# Patient Record
Sex: Female | Born: 1965 | Race: White | Hispanic: No | Smoking: Never smoker
Health system: Southern US, Community
[De-identification: ages and names within clinical notes are randomized; demographics above are authoritative.]

## PROBLEM LIST (undated history)

## (undated) DIAGNOSIS — Z800 Family history of malignant neoplasm of digestive organs: Secondary | ICD-10-CM

## (undated) DIAGNOSIS — N809 Endometriosis, unspecified: Secondary | ICD-10-CM

## (undated) DIAGNOSIS — E66811 Obesity, class 1: Secondary | ICD-10-CM

## (undated) DIAGNOSIS — N920 Excessive and frequent menstruation with regular cycle: Secondary | ICD-10-CM

## (undated) DIAGNOSIS — R112 Nausea with vomiting, unspecified: Secondary | ICD-10-CM

## (undated) DIAGNOSIS — E669 Obesity, unspecified: Secondary | ICD-10-CM

## (undated) DIAGNOSIS — N800 Endometriosis of uterus: Secondary | ICD-10-CM

## (undated) DIAGNOSIS — Z9889 Other specified postprocedural states: Secondary | ICD-10-CM

## (undated) DIAGNOSIS — N63 Unspecified lump in unspecified breast: Secondary | ICD-10-CM

## (undated) DIAGNOSIS — N8003 Adenomyosis of the uterus: Secondary | ICD-10-CM

## (undated) DIAGNOSIS — C4491 Basal cell carcinoma of skin, unspecified: Secondary | ICD-10-CM

## (undated) HISTORY — DX: Obesity, unspecified: E66.9

## (undated) HISTORY — PX: ANTERIOR CRUCIATE LIGAMENT REPAIR: SHX115

## (undated) HISTORY — DX: Unspecified lump in unspecified breast: N63.0

## (undated) HISTORY — DX: Basal cell carcinoma of skin, unspecified: C44.91

## (undated) HISTORY — DX: Excessive and frequent menstruation with regular cycle: N92.0

## (undated) HISTORY — DX: Endometriosis of uterus: N80.0

## (undated) HISTORY — PX: ABDOMINAL HYSTERECTOMY: SHX81

## (undated) HISTORY — DX: Family history of malignant neoplasm of digestive organs: Z80.0

## (undated) HISTORY — DX: Endometriosis, unspecified: N80.9

## (undated) HISTORY — DX: Obesity, class 1: E66.811

## (undated) HISTORY — DX: Adenomyosis of the uterus: N80.03

## (undated) HISTORY — PX: BREAST CYST EXCISION: SHX579

---

## 1985-01-25 HISTORY — PX: CRYOTHERAPY: SHX1416

## 1986-01-25 HISTORY — PX: CERVICAL CONE BIOPSY: SUR198

## 2004-09-15 ENCOUNTER — Ambulatory Visit: Payer: Self-pay

## 2006-01-06 ENCOUNTER — Ambulatory Visit: Payer: Self-pay

## 2007-01-10 ENCOUNTER — Ambulatory Visit: Payer: Self-pay

## 2009-01-25 HISTORY — PX: LAPAROSCOPIC SUPRACERVICAL HYSTERECTOMY: SUR797

## 2009-01-25 HISTORY — PX: DILATION AND CURETTAGE, DIAGNOSTIC / THERAPEUTIC: SUR384

## 2009-01-25 HISTORY — PX: CARPAL TUNNEL RELEASE: SHX101

## 2009-09-26 ENCOUNTER — Ambulatory Visit: Payer: Self-pay

## 2009-10-02 ENCOUNTER — Ambulatory Visit: Payer: Self-pay

## 2009-10-06 LAB — PATHOLOGY REPORT

## 2009-10-24 ENCOUNTER — Ambulatory Visit: Payer: Self-pay

## 2009-10-25 HISTORY — PX: LAPAROSCOPIC SUPRACERVICAL HYSTERECTOMY: SUR797

## 2009-10-28 ENCOUNTER — Ambulatory Visit: Payer: Self-pay

## 2010-01-02 ENCOUNTER — Ambulatory Visit: Payer: Self-pay | Admitting: Unknown Physician Specialty

## 2010-01-13 ENCOUNTER — Ambulatory Visit: Payer: Self-pay | Admitting: Unknown Physician Specialty

## 2015-04-01 ENCOUNTER — Encounter: Payer: Self-pay | Admitting: General Surgery

## 2015-04-07 ENCOUNTER — Ambulatory Visit (INDEPENDENT_AMBULATORY_CARE_PROVIDER_SITE_OTHER): Payer: BLUE CROSS/BLUE SHIELD | Admitting: General Surgery

## 2015-04-07 ENCOUNTER — Encounter: Payer: Self-pay | Admitting: General Surgery

## 2015-04-07 VITALS — BP 138/86 | HR 76 | Resp 12 | Ht 65.0 in | Wt 199.0 lb

## 2015-04-07 DIAGNOSIS — Z1211 Encounter for screening for malignant neoplasm of colon: Secondary | ICD-10-CM

## 2015-04-07 MED ORDER — POLYETHYLENE GLYCOL 3350 17 GM/SCOOP PO POWD: ORAL | Status: DC

## 2015-04-07 NOTE — Patient Instructions (Addendum)
Colonoscopy A colonoscopy is an exam to look at the entire large intestine (colon). This exam can help find problems such as tumors, polyps, inflammation, and areas of bleeding. The exam takes about 1 hour.  LET [facility] CARE PROVIDER KNOW ABOUT:   Any allergies you have.  All medicines you are taking, including vitamins, herbs, eye drops, creams, and over-the-counter medicines.  Previous problems you or members of your family have had with the use of anesthetics.  Any blood disorders you have.  Previous surgeries you have had.  Medical conditions you have. RISKS AND COMPLICATIONS  Generally, this is a safe procedure. However, as with any procedure, complications can occur. Possible complications include:  Bleeding.  Tearing or rupture of the colon wall.  Reaction to medicines given during the exam.  Infection (rare). BEFORE THE PROCEDURE   Ask your health care provider about changing or stopping your regular medicines.  You may be prescribed an oral bowel prep. This involves drinking a large amount of medicated liquid, starting the day before your procedure. The liquid will cause you to have multiple loose stools until your stool is almost clear or light green. This cleans out your colon in preparation for the procedure.  Do not eat or drink anything else once you have started the bowel prep, unless your health care provider tells you it is safe to do so.  Arrange for someone to drive you home after the procedure. PROCEDURE   You will be given medicine to help you relax (sedative).  You will lie on your side with your knees bent.  A long, flexible tube with a light and camera on the end (colonoscope) will be inserted through the rectum and into the colon. The camera sends video back to a computer screen as it moves through the colon. The colonoscope also releases carbon dioxide gas to inflate the colon. This helps your health care provider see the area better.  During  the exam, your health care provider may take a small tissue sample (biopsy) to be examined under a microscope if any abnormalities are found.  The exam is finished when the entire colon has been viewed. AFTER THE PROCEDURE   Do not drive for 24 hours after the exam.  You may have a small amount of blood in your stool.  You may pass moderate amounts of gas and have mild abdominal cramping or bloating. This is caused by the gas used to inflate your colon during the exam.  Ask when your test results will be ready and how you will get your results. Make sure you get your test results.   This information is not intended to replace advice given to you by your health care provider. Make sure you discuss any questions you have with your health care provider.   Document Released: 01/09/2000 Document Revised: 11/01/2012 Document Reviewed: 09/18/2012 Elsevier Interactive Patient Education [employer].  Patient has been scheduled for a colonoscopy on 06-04-15 at [facility].

## 2015-04-07 NOTE — Progress Notes (Signed)
Patient ID: Stephanie Wade, female   DOB: [birth year], 50 y.o.   MRN: [id number]  Chief Complaint  Patient presents with  . Colonoscopy    HPI Stephanie Wade is a 50 y.o. female here today for a evaluation of a screening colonoscopy. No GI problems at this time. Occasional hemorrhoids. Maternal grandmother has passed from colon cancer. No current health issues. Hysterectomy performed for uterine fibroids.  I have reviewed the history of present illness with the patient.  HPI  History reviewed. No pertinent past medical history.  Past Surgical History  Procedure Laterality Date  . Abdominal hysterectomy    . Hand surgery  2011    History reviewed. No pertinent family history.  Social History Social History  Substance Use Topics  . Smoking status: Never Smoker   . Smokeless tobacco: None  . Alcohol Use: 0.0 oz/week    0 Standard drinks or equivalent per week    No Known Allergies  Current Outpatient Prescriptions  Medication Sig Dispense Refill  . polyethylene glycol powder (GLYCOLAX/MIRALAX) powder 255 grams one bottle for colonoscopy prep 255 g 0   No current facility-administered medications for this visit.    Review of Systems Review of Systems  Constitutional: Negative.   Respiratory: Negative.   Cardiovascular: Negative.   Gastrointestinal: Negative.     Blood pressure 138/86, pulse 76, resp. rate 12, height 5\' 5"  (1.651 m), weight 199 lb (90.266 kg).  Physical Exam Physical Exam  Constitutional: She is oriented to person, place, and time. She appears well-developed and well-nourished.  Eyes: Conjunctivae are normal. No scleral icterus.  Neck: Neck supple.  Cardiovascular: Normal rate, regular rhythm and normal heart sounds.   Pulmonary/Chest: Effort normal and breath sounds normal.  Abdominal: Soft. Normal appearance and bowel sounds are normal. There is no hepatomegaly. There is no tenderness. No hernia.  Lymphadenopathy:    She has no cervical  adenopathy.  Neurological: She is alert and oriented to person, place, and time.  Skin: Skin is warm and dry.  Psychiatric: Her behavior is normal.    Data Reviewed Notes reviewed  Assessment    Stable exam, no significant findings noted. Family history remote -in grandmother only. Discussed role of colonoscopy as a screening method. Risks and benefits explained. Pt is agreeable.     Plan    Colonoscopy with possible biopsy/polypectomy prn: Information regarding the procedure, including its potential risks and complications (including but not limited to perforation of the bowel, which may require emergency surgery to repair, and bleeding) was verbally given to the patient. Educational information regarding lower intestinal endoscopy was given to the patient. Written instructions for how to complete the bowel prep using Miralax were provided. The importance of drinking ample fluids to avoid dehydration as a result of the prep emphasized.    Patient has been scheduled for a colonoscopy on 06-04-15 at [facility].   This information has been scribed by Gaspar Cola CMA.   Chasitty Hehl G 04/07/2015, 4:21 PM

## 2015-06-04 ENCOUNTER — Encounter
Admission: RE | Disposition: A | Discharge status: Home / Self Care | Payer: Self-pay | Source: Ambulatory Visit | Attending: General Surgery

## 2015-06-04 ENCOUNTER — Ambulatory Visit
Admission: RE | Admit: 2015-06-04 | Discharge: 2015-06-04 | Disposition: A | Discharge status: Home / Self Care | Payer: BLUE CROSS/BLUE SHIELD | Source: Ambulatory Visit | Attending: General Surgery | Admitting: General Surgery

## 2015-06-04 ENCOUNTER — Ambulatory Visit: Payer: BLUE CROSS/BLUE SHIELD | Admitting: Anesthesiology

## 2015-06-04 ENCOUNTER — Encounter: Payer: Self-pay | Admitting: *Deleted

## 2015-06-04 DIAGNOSIS — Z800 Family history of malignant neoplasm of digestive organs: Secondary | ICD-10-CM | POA: Insufficient documentation

## 2015-06-04 DIAGNOSIS — Z79899 Other long term (current) drug therapy: Secondary | ICD-10-CM | POA: Insufficient documentation

## 2015-06-04 DIAGNOSIS — K5730 Diverticulosis of large intestine without perforation or abscess without bleeding: Secondary | ICD-10-CM | POA: Diagnosis not present

## 2015-06-04 DIAGNOSIS — D128 Benign neoplasm of rectum: Secondary | ICD-10-CM | POA: Diagnosis not present

## 2015-06-04 DIAGNOSIS — D125 Benign neoplasm of sigmoid colon: Secondary | ICD-10-CM | POA: Insufficient documentation

## 2015-06-04 DIAGNOSIS — Z1211 Encounter for screening for malignant neoplasm of colon: Secondary | ICD-10-CM | POA: Diagnosis present

## 2015-06-04 HISTORY — PX: COLONOSCOPY WITH PROPOFOL: SHX5780

## 2015-06-04 SURGERY — COLONOSCOPY WITH PROPOFOL
Anesthesia: General

## 2015-06-04 MED ORDER — SODIUM CHLORIDE 0.9 % IV SOLN
INTRAVENOUS | Status: DC
Start: 2015-06-04 — End: 2015-06-04
  Administered 2015-06-04: 1000 mL via INTRAVENOUS

## 2015-06-04 MED ORDER — ONDANSETRON HCL 4 MG/2ML IJ SOLN
INTRAMUSCULAR | Status: DC | PRN
  Administered 2015-06-04: 4 mg via INTRAVENOUS

## 2015-06-04 MED ORDER — PROPOFOL 10 MG/ML IV BOLUS
INTRAVENOUS | Status: DC | PRN
  Administered 2015-06-04: 80 mg via INTRAVENOUS

## 2015-06-04 MED ORDER — PROPOFOL 500 MG/50ML IV EMUL
INTRAVENOUS | Status: DC | PRN
Start: 2015-06-04 — End: 2015-06-04
  Administered 2015-06-04: 150 ug/kg/min via INTRAVENOUS

## 2015-06-04 MED ORDER — LACTATED RINGERS IV SOLN
INTRAVENOUS | Status: DC | PRN
  Administered 2015-06-04: 08:00:00 via INTRAVENOUS

## 2015-06-04 NOTE — Anesthesia Postprocedure Evaluation (Signed)
Anesthesia Post Note  Patient: Stephanie Wade  Procedure(s) Performed: Procedure(s) (LRB): COLONOSCOPY WITH PROPOFOL (N/A)  Patient location during evaluation: Endoscopy Anesthesia Type: General Level of consciousness: awake and alert Pain management: pain level controlled Vital Signs Assessment: post-procedure vital signs reviewed and stable Respiratory status: spontaneous breathing, nonlabored ventilation, respiratory function stable and patient connected to nasal cannula oxygen Cardiovascular status: blood pressure returned to baseline and stable Postop Assessment: no signs of nausea or vomiting Anesthetic complications: no    Last Vitals:  Filed Vitals:   06/04/15 0900 06/04/15 0910  BP: 96/80 99/71  Pulse: 62 55  Temp:    Resp: 15 8    Last Pain: There were no vitals filed for this visit.               Precious Haws Katrena Stehlin

## 2015-06-04 NOTE — Transfer of Care (Signed)
Immediate Anesthesia Transfer of Care Note  Patient: Stephanie Wade  Procedure(s) Performed: Procedure(s): COLONOSCOPY WITH PROPOFOL (N/A)  Patient Location: PACU and Endoscopy Unit  Anesthesia Type:General  Level of Consciousness: awake, alert  and oriented  Airway & Oxygen Therapy: Patient Spontanous Breathing and Patient connected to nasal cannula oxygen  Post-op Assessment: Report given to RN and Post -op Vital signs reviewed and stable  Post vital signs: Reviewed and stable  Last Vitals:  Filed Vitals:   06/04/15 0719  BP: 113/78  Pulse: 67  Temp: 36.2 C  Resp: 16    Last Pain: There were no vitals filed for this visit.       Complications: No apparent anesthesia complications

## 2015-06-04 NOTE — H&P (Signed)
Stephanie Wade is an 50 y.o. female.   Chief Complaint: scheduled colonoscopy HPI: 50 yr old female here for planned screening colonoscopy. No complaints. Remote FH of colon cabncer  Past Medical History  Diagnosis Date  . Medical history non-contributory     Past Surgical History  Procedure Laterality Date  . Abdominal hysterectomy    . Hand surgery  2011  . Anterior cruciate ligament repair      History reviewed. No pertinent family history. Social History:  reports that she has never smoked. She does not have any smokeless tobacco history on file. She reports that she drinks alcohol. She reports that she does not use illicit drugs.  Allergies: No Known Allergies  Medications Prior to Admission  Medication Sig Dispense Refill  . polyethylene glycol powder (GLYCOLAX/MIRALAX) powder 255 grams one bottle for colonoscopy prep 255 g 0    No results found for this or any previous visit (from the past 51 hour(s)). No results found.  Review of Systems  Constitutional: Negative.   Respiratory: Negative.   Cardiovascular: Negative.   Gastrointestinal: Negative.   Genitourinary: Negative.     Blood pressure 113/78, pulse 67, temperature 97.2 F (36.2 C), temperature source Tympanic, resp. rate 16, height 5\' 5"  (1.651 m), weight 192 lb (87.091 kg), SpO2 98 %. Physical Exam  Constitutional: She is oriented to person, place, and time. She appears well-developed and well-nourished.  Eyes: Conjunctivae are normal. No scleral icterus.  Cardiovascular: Normal rate, regular rhythm and normal heart sounds.   Respiratory: Effort normal and breath sounds normal.  GI: Soft. Bowel sounds are normal. She exhibits no mass.  Lymphadenopathy:    She has no cervical adenopathy.  Neurological: She is alert and oriented to person, place, and time.  Skin: Skin is warm and dry.     Assessment/Plan Healthy individual. OK to proceed with colonoscopy.  Christene Lye, MD 06/04/2015, 8:10  AM

## 2015-06-04 NOTE — Anesthesia Preprocedure Evaluation (Signed)
Anesthesia Evaluation  Patient identified by MRN, date of birth, ID band Patient awake    Reviewed: Allergy & Precautions, H&P , NPO status , Patient's Chart, lab work & pertinent test results  History of Anesthesia Complications (+) PONV and history of anesthetic complications  Airway Mallampati: II  TM Distance: >3 FB Neck ROM: full    Dental  (+) Teeth Intact   Pulmonary neg pulmonary ROS, neg shortness of breath,    Pulmonary exam normal breath sounds clear to auscultation       Cardiovascular Exercise Tolerance: Good (-) angina(-) DOE negative cardio ROS Normal cardiovascular exam Rhythm:regular Rate:Normal     Neuro/Psych negative neurological ROS  negative psych ROS   GI/Hepatic negative GI ROS, Neg liver ROS, neg GERD  ,  Endo/Other  negative endocrine ROS  Renal/GU negative Renal ROS  negative genitourinary   Musculoskeletal   Abdominal   Peds  Hematology negative hematology ROS (+)   Anesthesia Other Findings Past Medical History:   Medical history non-contributory                            Past Surgical History:   ABDOMINAL HYSTERECTOMY                                        HAND SURGERY                                     2011         ANTERIOR CRUCIATE LIGAMENT REPAIR                            BMI    Body Mass Index   31.95 kg/m 2      Reproductive/Obstetrics negative OB ROS                             Anesthesia Physical Anesthesia Plan  ASA: II  Anesthesia Plan: General   Post-op Pain Management:    Induction:   Airway Management Planned:   Additional Equipment:   Intra-op Plan:   Post-operative Plan:   Informed Consent: I have reviewed the patients History and Physical, chart, labs and discussed the procedure including the risks, benefits and alternatives for the proposed anesthesia with the patient or authorized representative who has indicated  his/her understanding and acceptance.   Dental Advisory Given  Plan Discussed with: Anesthesiologist, CRNA and Surgeon  Anesthesia Plan Comments:         Anesthesia Quick Evaluation

## 2015-06-04 NOTE — Op Note (Signed)
[facility] Gastroenterology Patient Name: Stephanie Wade Procedure Date: 06/04/2015 8:14 AM MRN: [id number] Account #: [id number] Date of Birth: October 17, 1965 Admit Type: Outpatient Age: 50 Room: [facility] ENDO ROOM 4 Gender: Female Note Status: Finalized Procedure:            Colonoscopy Indications:          Screening for colorectal malignant neoplasm, Screening                        for colon cancer: Family history of colorectal cancer                        in distant relative(s) Providers:            Garrett Bowring G. Jamal Collin, MD Referring MD:         Forest Gleason Md, MD (Referring MD) Medicines:            General Anesthesia Complications:        No immediate complications. Procedure:            Pre-Anesthesia Assessment:                       - Using IV propofol under the supervision of an                        anesthesiologist was determined to be medically                        necessary for this procedure based on review of the                        patient's medical history, medications, and prior                        anesthesia history.                       After obtaining informed consent, the colonoscope was                        passed under direct vision. Throughout the procedure,                        the patient's blood pressure, pulse, and oxygen                        saturations were monitored continuously. The                        Colonoscope was introduced through the anus and                        advanced to the the cecum, identified by the ileocecal                        valve. The colonoscopy was performed without                        difficulty. The patient tolerated the procedure well.  The quality of the bowel preparation was excellent. Findings:      The perianal and digital rectal examinations were normal.      A few small-mouthed diverticula were found in the sigmoid colon.      A 5 mm polyp was found in  the distal sigmoid colon. The polyp was       semi-pedunculated. The polyp was removed with a hot snare. Resection and       retrieval were complete.      A 5 mm polyp was found in the rectum. The polyp was semi-pedunculated.       The polyp was removed with a hot snare. Resection and retrieval were       complete.      The exam was otherwise without abnormality on direct and retroflexion       views. Impression:           - Diverticulosis in the sigmoid colon.                       - One 5 mm polyp in the distal sigmoid colon, removed                        with a hot snare. Resected and retrieved.                       - One 5 mm polyp (adenomatous) in the rectum, removed                        with a hot snare. Resected and retrieved.                       - The examination was otherwise normal on direct and                        retroflexion views. Recommendation:       - Await pathology results.                       - Repeat colonoscopy in 5 years for surveillance. Procedure Code(s):    --- Professional ---                       [phone], Colonoscopy, flexible; with removal of tumor(s),                        polyp(s), or other lesion(s) by snare technique Diagnosis Code(s):    --- Professional ---                       Z80.0, Family history of malignant neoplasm of                        digestive organs                       Z12.11, Encounter for screening for malignant neoplasm                        of colon                       D12.5, Benign neoplasm of sigmoid colon  D12.8, Benign neoplasm of rectum                       K57.30, Diverticulosis of large intestine without                        perforation or abscess without bleeding CPT copyright 2016 American Medical Association. All rights reserved. The codes documented in this report are preliminary and upon coder review may  be revised to meet current compliance requirements. Christene Lye,  MD 06/04/2015 8:48:43 AM This report has been signed electronically. Number of Addenda: 0 Note Initiated On: 06/04/2015 8:14 AM Scope Withdrawal Time: 0 hours 11 minutes 51 seconds  Total Procedure Duration: 0 hours 23 minutes 34 seconds       [facility]

## 2015-06-05 ENCOUNTER — Encounter: Payer: Self-pay | Admitting: *Deleted

## 2015-06-05 ENCOUNTER — Telehealth: Payer: Self-pay | Admitting: *Deleted

## 2015-06-05 LAB — SURGICAL PATHOLOGY

## 2015-06-05 NOTE — Telephone Encounter (Signed)
Notified patient as instructed, patient pleased. Discussed follow-up appointments, patient agrees  

## 2015-06-05 NOTE — Telephone Encounter (Signed)
-----   Message from Christene Lye, MD sent at 06/05/2015 10:16 AM EDT ----- Please let pt pt know the pathology was normal. Both polyps were true polyps but with no cancer type changes. Needs colonoscopy in 5 yrs

## 2015-06-07 ENCOUNTER — Encounter: Payer: Self-pay | Admitting: General Surgery

## 2016-06-30 ENCOUNTER — Ambulatory Visit (INDEPENDENT_AMBULATORY_CARE_PROVIDER_SITE_OTHER): Payer: 59 | Admitting: Obstetrics and Gynecology

## 2016-06-30 ENCOUNTER — Encounter: Payer: Self-pay | Admitting: Obstetrics and Gynecology

## 2016-06-30 VITALS — BP 118/78 | HR 79 | Ht 65.0 in | Wt 204.0 lb

## 2016-06-30 DIAGNOSIS — L739 Follicular disorder, unspecified: Secondary | ICD-10-CM | POA: Diagnosis not present

## 2016-06-30 NOTE — Progress Notes (Signed)
   Chief Complaint  Patient presents with  . Breast Problem    bump on left breast     HPI:      [name] is a 51 y.o. No obstetric history on file. who LMP was No LMP recorded. Patient has had a hysterectomy., presents today for breast symptoms. She complains of a bump on her LT breast that started 4/18. At first it felt like a "zit" but it has gotten a little larger. It is not tender and hasn't drained. No pain. No breast masses. She has a hx of a benign breast mass eval by Dr. Pat Wade about 2009. No FH breast/ovar ca. No personal hx of skin cancer.  Last mammo 04/11/15 birads-1.  She has annual exam scheduled 08/04/16.  Past Medical History:  Diagnosis Date  . Medical history non-contributory     Past Surgical History:  Procedure Laterality Date  . ABDOMINAL HYSTERECTOMY    . ANTERIOR CRUCIATE LIGAMENT REPAIR    . COLONOSCOPY WITH PROPOFOL N/A 06/04/2015   Procedure: COLONOSCOPY WITH PROPOFOL;  Surgeon: Stephanie Lye, MD;  Location: [facility] ENDOSCOPY;  Service: Endoscopy;  Laterality: N/A;  . HAND SURGERY  2011    Family History  Problem Relation Age of Onset  . Colon cancer Maternal Aunt 76  . Colon cancer Maternal Grandmother 30  . Alzheimer's disease Maternal Grandfather   . Diabetes Paternal Grandmother   . Heart disease Paternal Grandmother   . Diabetes Paternal Grandfather     ROS:  Review of Systems  Constitutional: Negative for fever, malaise/fatigue and weight loss.  Gastrointestinal: Negative for blood in stool, constipation, diarrhea, nausea and vomiting.  Genitourinary: Negative for dysuria, flank pain, frequency, hematuria and urgency.  Musculoskeletal: Negative for back pain.  Skin: Negative for itching and rash.       Lesion LT breast  Breast ROS: negative for breast lumps   Objective: BP 118/78   Pulse 79   Ht 5\' 5"  (1.651 m)   Wt 204 lb (92.5 kg)   BMI 33.95 kg/m    Physical Exam  Constitutional: She appears well-developed.    Pulmonary/Chest: She exhibits no mass, no tenderness, no laceration and no swelling. Right breast exhibits no inverted nipple, no mass, no nipple discharge, no skin change and no tenderness. Left breast exhibits no inverted nipple, no mass, no nipple discharge, no skin change and no tenderness.  Skin: Skin is warm and dry. Lesion noted.     ~6 MM FIRM, WHITISH SUPERFICIAL NODULE LT CHEST WALL/BREAST ABOUT 10:00; NO D/C, ERYTHEMA, TENDERNESS  Vitals reviewed.   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 Warm compresses/leave alone. Reassurance. F/u at annual in 1 mo/sooner prn.     F/U  Return if symptoms worsen or fail to improve.  Stephanie Jeane B. Brettany Sydney, PA-C 06/30/2016 5:18 PM

## 2016-08-04 ENCOUNTER — Ambulatory Visit (INDEPENDENT_AMBULATORY_CARE_PROVIDER_SITE_OTHER): Payer: 59 | Admitting: Certified Nurse Midwife

## 2016-08-04 ENCOUNTER — Encounter: Payer: Self-pay | Admitting: Certified Nurse Midwife

## 2016-08-04 VITALS — BP 112/66 | HR 87 | Ht 65.0 in | Wt 207.0 lb

## 2016-08-04 DIAGNOSIS — Z1231 Encounter for screening mammogram for malignant neoplasm of breast: Secondary | ICD-10-CM

## 2016-08-04 DIAGNOSIS — E669 Obesity, unspecified: Secondary | ICD-10-CM

## 2016-08-04 DIAGNOSIS — Z01419 Encounter for gynecological examination (general) (routine) without abnormal findings: Secondary | ICD-10-CM

## 2016-08-04 DIAGNOSIS — Z1239 Encounter for other screening for malignant neoplasm of breast: Secondary | ICD-10-CM

## 2016-08-04 NOTE — Progress Notes (Addendum)
Gynecology Annual Exam  PCP: Patient, No Pcp Per  Chief Complaint:  Chief Complaint  Patient presents with  . Gynecologic Exam    History of Present Illness: Stephanie Wade is a 51 y.o. G2P2002 who presents for her annual exam. The patient has no significant complaints. Was seen by Elmo Putt Copland earlier this month for a benign skin lesion on the left breast. It has not changed.  Her menses are absent due to a [facility] 2011 for adenomyosis.  Last pap smear: 04/01/2015, results were NIL/neg   The patient is sexually active. She does not have dyspareunia.Started to have hot flashes after her [place], but they have decreased over the years.  Since her last visit, she has had a colonoscopy 06/04/2015. Results: polyps. Next colonoscopy due in 5 years.  Her past medical history is remarkable for a remote history of abnormal Pap smears in the 1980s requiring a cryo and conization, adenomyosis,  and obesity (current BMI 34.45)  The patient does perform self breast exams. Her last mammogram was 04/11/15, results were negative.   There is no family history of breast cancer.   There is no family history of ovarian cancer.  The patient denies smoking.  She reports drinking 1-2 glasses of wine a month.   She denies illegal drug use.  The patient reports exercising regularly.  The patient denies current symptoms of depression.    Review of Systems: Review of Systems  Constitutional: Negative for chills, fever and weight loss.  HENT: Negative for congestion, sinus pain and sore throat.   Eyes: Negative for blurred vision and pain.  Respiratory: Negative for hemoptysis, shortness of breath and wheezing.   Cardiovascular: Negative for chest pain, palpitations and leg swelling.  Gastrointestinal: Negative for abdominal pain, blood in stool, diarrhea, heartburn, nausea and vomiting.  Genitourinary: Negative for dysuria, frequency, hematuria and urgency.  Musculoskeletal: Negative for back  pain, joint pain and myalgias.  Skin: Negative for itching and rash.  Neurological: Negative for dizziness, tingling and headaches.  Endo/Heme/Allergies: Negative for environmental allergies and polydipsia. Does not bruise/bleed easily.       Negative for hirsutism   Psychiatric/Behavioral: Negative for depression. The patient is not nervous/anxious and does not have insomnia.     Past Medical History:  Past Medical History:  Diagnosis Date  . Adenomyosis   . Breast mass 2009/2010  . Menorrhagia   . Obesity (BMI 30.0-34.9)     Past Surgical History:  Past Surgical History:  Procedure Laterality Date  . ANTERIOR CRUCIATE LIGAMENT REPAIR Left   . CARPAL TUNNEL RELEASE Bilateral 2011  . CERVICAL CONE BIOPSY  1988  . COLONOSCOPY WITH PROPOFOL N/A 06/04/2015   Procedure: COLONOSCOPY WITH PROPOFOL;  Surgeon: Christene Lye, MD;  Location: [facility] ENDOSCOPY;  Service: Endoscopy;  Laterality: N/A;  . CRYOTHERAPY  1987   of cervix  . DILATION AND CURETTAGE, DIAGNOSTIC / THERAPEUTIC  2011  . LAPAROSCOPIC SUPRACERVICAL HYSTERECTOMY  10/2009   LSH for adenomyosis Dr. Laurey Morale    Family History:  Family History  Problem Relation Age of Onset  . Colon cancer Maternal Aunt 72  . Colon cancer Maternal Grandmother 72  . Alzheimer's disease Maternal Grandfather   . Diabetes Paternal Grandmother   . Heart disease Paternal Grandmother   . Hypertension Paternal Grandmother   . Heart attack Paternal Grandmother   . Diabetes Paternal Grandfather   . Kidney cancer Paternal Grandfather   . Hypertension Paternal Grandfather   . Melanoma Brother 71  on foot  . Pancreatic cancer Other 38  . Diabetes Maternal Uncle     Social History:  Social History   Social History  . Marital status: Divorced    Spouse name: N/A  . Number of children: 2  . Years of education: N/A   Occupational History  . Not on file.   Social History Main Topics  . Smoking status: Never Smoker  .  Smokeless tobacco: Never Used  . Alcohol use Yes     Comment: 1-2 glasses wine/month  . Drug use: No  . Sexual activity: Yes    Partners: Male    Birth control/ protection: Surgical   Other Topics Concern  . Not on file   Social History Narrative  . No narrative on file    Allergies:  No Known Allergies  Medications: Prior to Admission medications   Not on File    Physical Exam Vitals: BP 112/66   Pulse 87   Ht 5\' 5"  (1.651 m)   Wt 207 lb (93.9 kg)   BMI 34.45 kg/m   General: WF in NAD HEENT: normocephalic, anicteric Neck: no thyroid enlargement, no palpable nodules, no cervical lymphadenopathy  Pulmonary: No increased work of breathing, CTAB Cardiovascular: RRR, without murmur  Breast: Breast symmetrical, no tenderness, no palpable nodules or masses, no skin or nipple retraction present, no nipple discharge.  No axillary, infraclavicular or supraclavicular lymphadenopathy. Subcentimeter epidermal inclusion cyst on skin of left upper inner breast Abdomen: Soft, non-tender, non-distended.  Umbilicus without lesions.  No hepatomegaly or masses palpable. No evidence of hernia. Genitourinary:  External: Normal external female genitalia.  Normal urethral meatus, normal Bartholin's and Skene's glands.    Vagina: Normal vaginal mucosa, no evidence of prolapse.    Cervix: Grossly normal in appearance, no bleeding, non-tender  Uterus: surgically absent  Adnexa: No adnexal masses, non-tender  Rectal: deferred  Lymphatic: no evidence of inguinal lymphadenopathy Extremities: no edema, erythema, or tenderness Neurologic: Grossly intact Psychiatric: mood appropriate, affect full Skin: tanned, freckled back, chest, arms, legs, face.      Assessment: 51 y.o. [id number] well woman exam Benign epidermal cyst on left breast  Plan:   1) Breast cancer screening - recommend monthly self breast exam. Mammogram was ordered today.  2) Colon cancer screen-colonoscopy last year (UTD).  Next one due 2022  3) Cervical cancer screening - Pap declined. ASCCP guidelines and rational discussed.  Patient opts for 2 screening interval. Due 2019  4) Routine healthcare maintenance including cholesterol and diabetes screening discussed. Last done about 3 years ago. Desires next year.   5) RTO 1 year for annual exam.  Dalia Heading, CNM

## 2016-08-08 ENCOUNTER — Encounter: Payer: Self-pay | Admitting: Certified Nurse Midwife

## 2017-01-05 ENCOUNTER — Ambulatory Visit
Admission: RE | Admit: 2017-01-05 | Discharge: 2017-01-05 | Disposition: A | Discharge status: Home / Self Care | Payer: 59 | Source: Ambulatory Visit | Attending: Certified Nurse Midwife | Admitting: Certified Nurse Midwife

## 2017-01-05 DIAGNOSIS — Z1231 Encounter for screening mammogram for malignant neoplasm of breast: Secondary | ICD-10-CM | POA: Diagnosis not present

## 2017-01-05 DIAGNOSIS — Z1239 Encounter for other screening for malignant neoplasm of breast: Secondary | ICD-10-CM

## 2017-01-24 ENCOUNTER — Other Ambulatory Visit: Payer: Self-pay | Admitting: *Deleted

## 2017-01-24 ENCOUNTER — Inpatient Hospital Stay
Admission: RE | Admit: 2017-01-24 | Discharge: 2017-01-24 | Disposition: A | Discharge status: Home / Self Care | Payer: Self-pay | Source: Ambulatory Visit | Attending: *Deleted | Admitting: *Deleted

## 2017-01-24 DIAGNOSIS — Z9289 Personal history of other medical treatment: Secondary | ICD-10-CM

## 2017-01-25 DIAGNOSIS — C4491 Basal cell carcinoma of skin, unspecified: Secondary | ICD-10-CM

## 2017-01-25 HISTORY — DX: Basal cell carcinoma of skin, unspecified: C44.91

## 2017-02-07 ENCOUNTER — Telehealth: Payer: Self-pay

## 2017-02-07 NOTE — Telephone Encounter (Signed)
Could be slightly infected. Try warm compresses to see if it will drain. Do not push on it. If persists, we can see her to determine if needs abx or best person for ref.

## 2017-02-07 NOTE — Telephone Encounter (Signed)
Pt seen a few months ago for nodule/cyst on her breast. ABC said nothing to worry about. Pt saw CLG for AE 12/2016 who stated the same. Pt states area has gotten bigger & is spread out. The skin is red around it and it hurts. It looks like a massive pimple. Pt inquiring if she needs to go to dermatology or see Dr. Pat Patrick? Also, if she needs a referral as she doesn't have a primary. [phone]

## 2017-02-08 NOTE — Telephone Encounter (Signed)
Gen surgeon is probably better than derm. Pt probably doesn't need ref. She can call first--if needs ref from [place] then we can do it.

## 2017-02-08 NOTE — Telephone Encounter (Signed)
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 This has been going on for a year & she has to pay out of pocket for everything anyway. She is trying to avoid seeing [place] & still being referred & also having to pay for that visit. Pt states that it is now visible thru her shirt from the side.

## 2017-02-08 NOTE — Telephone Encounter (Signed)
Pt aware. Will call for apt & notify us if referral is needed.

## 2017-02-10 ENCOUNTER — Other Ambulatory Visit: Payer: Self-pay | Admitting: Obstetrics and Gynecology

## 2017-02-10 ENCOUNTER — Telehealth: Payer: Self-pay

## 2017-02-10 DIAGNOSIS — L739 Follicular disorder, unspecified: Secondary | ICD-10-CM

## 2017-02-10 NOTE — Progress Notes (Signed)
Refer to Dr. Bary Castilla for worsening sebaceous gland on LT breast. See 06/30/16 note.

## 2017-02-10 NOTE — Telephone Encounter (Signed)
Ref order in. Pls send 06/30/16 note. Thx.

## 2017-02-10 NOTE — Telephone Encounter (Signed)
Pt called Dr. Dwyane Luo office and they do need a referral from [place].  [phone]

## 2017-02-17 ENCOUNTER — Ambulatory Visit (INDEPENDENT_AMBULATORY_CARE_PROVIDER_SITE_OTHER): Payer: 59 | Admitting: General Surgery

## 2017-02-17 ENCOUNTER — Encounter: Payer: Self-pay | Admitting: General Surgery

## 2017-02-17 VITALS — BP 122/82 | HR 76 | Resp 14 | Ht 65.0 in | Wt 207.0 lb

## 2017-02-17 DIAGNOSIS — L729 Follicular cyst of the skin and subcutaneous tissue, unspecified: Secondary | ICD-10-CM | POA: Diagnosis not present

## 2017-02-17 NOTE — Patient Instructions (Addendum)
  Return for suture removal  May shower May remove dressing in 2-3 days Steri strips will gradually come off over 2-3 weeks May use an Ice pack as needed for comfort

## 2017-02-17 NOTE — Progress Notes (Signed)
Patient ID: Stephanie Wade, female   DOB: 1965/09/10, 52 y.o.   MRN: [id number]  Chief Complaint  Patient presents with  . Breast Problem    HPI Stephanie Wade is a 52 y.o. female.  who presents for a breast evaluation referred by Ardeth Perfect PA. The most recent mammogram was done on 01-05-17.  Patient does perform regular self breast checks and gets regular mammograms done.   She states that last March she noticed a pea size nodule left breast. She states that [place] both monitored the area. She states that it got larger and red about 3 weeks ago. She is using warm compresses which is not helping. She states it is visible through her shirt. No visible drainage. She is a Gaffer and owns 3 storage facilities.   HPI  Past Medical History:  Diagnosis Date  . Adenomyosis   . Breast mass 2009/2010  . Menorrhagia   . Obesity (BMI 30.0-34.9)     Past Surgical History:  Procedure Laterality Date  . ABDOMINAL HYSTERECTOMY    . ANTERIOR CRUCIATE LIGAMENT REPAIR Left   . CARPAL TUNNEL RELEASE Bilateral 2011  . CERVICAL CONE BIOPSY  1988  . COLONOSCOPY WITH PROPOFOL N/A 06/04/2015   Procedure: COLONOSCOPY WITH PROPOFOL;  Surgeon: Christene Lye, MD;  Location: [facility] ENDOSCOPY;  Service: Endoscopy;  Laterality: N/A;  . CRYOTHERAPY  1987   of cervix  . DILATION AND CURETTAGE, DIAGNOSTIC / THERAPEUTIC  2011  . LAPAROSCOPIC SUPRACERVICAL HYSTERECTOMY  10/2009   LSH for adenomyosis Dr. Laurey Morale    Family History  Problem Relation Age of Onset  . Colon cancer Maternal Aunt 72  . Colon cancer Maternal Grandmother 57  . Alzheimer's disease Maternal Grandfather   . Diabetes Paternal Grandmother   . Heart disease Paternal Grandmother   . Hypertension Paternal Grandmother   . Heart attack Paternal Grandmother   . Diabetes Paternal Grandfather   . Kidney cancer Paternal Grandfather   . Hypertension Paternal Grandfather   . Melanoma Brother 41        on foot  . Pancreatic cancer Other 13  . Diabetes Maternal Uncle   . Breast cancer Neg Hx     Social History Social History   Tobacco Use  . Smoking status: Never Smoker  . Smokeless tobacco: Never Used  Substance Use Topics  . Alcohol use: Yes    Comment: 1-2 glasses wine/month  . Drug use: No    No Known Allergies  No current outpatient medications on file.   No current facility-administered medications for this visit.     Review of Systems Review of Systems  Constitutional: Negative.   Respiratory: Negative.   Cardiovascular: Negative.     Blood pressure 122/82, pulse 76, resp. rate 14, height 5\' 5"  (1.651 m), weight 207 lb (93.9 kg), SpO2 98 %.  Physical Exam Physical Exam  Constitutional: She is oriented to person, place, and time. She appears well-developed and well-nourished.  Pulmonary/Chest:    Neurological: She is alert and oriented to person, place, and time.  Skin: Skin is warm and dry.  Skin cyst chest wall  Psychiatric: Her behavior is normal.    Data Reviewed Bilateral screening mammograms dated January 05, 2017 reviewed: Skin marker at site of palpable nodule.   Fatty replaced breasts.BI-RADS-1.    Assessment    Sebaceous cyst.     Plan    The patient was amenable to excision, especially in light of the recent increase in size  and subsequent partial resolution.  Alcohol was applied to the skin followed by 10 cc of 0.5% Xylocaine with 0.25% Marcaine with 1-200,000 units of epinephrine.  ChloraPrep was applied to the skin in the area was excised through elliptical incision.  The entire cyst wall was removed.  This was sent for routine histology.  The wound was closed with interrupted 4-0 Prolene sutures.  Telfa and Tegaderm dressing applied.  Patient instructed in regards to postoperative wound care.  Tylenol or ibuprofen for pain.  Ice for comfort.     Return for suture removal The patient will be contacted when pathology is  available.  HPI, Physical Exam, Assessment and Plan have been scribed under the direction and in the presence of Robert Bellow, MD. Stephanie Fetch, RN  I have completed the exam and reviewed the above documentation for accuracy and completeness.  I agree with the above.  Haematologist has been used and any errors in dictation or transcription are unintentional.  Hervey Ard, M.D., F.A.C.S.   Forest Gleason Fread Kottke 02/17/2017, 8:02 PM

## 2017-02-24 ENCOUNTER — Ambulatory Visit: Payer: 59 | Admitting: *Deleted

## 2017-02-24 ENCOUNTER — Telehealth: Payer: Self-pay

## 2017-02-24 VITALS — Ht 65.0 in

## 2017-02-24 DIAGNOSIS — L729 Follicular cyst of the skin and subcutaneous tissue, unspecified: Secondary | ICD-10-CM

## 2017-02-24 NOTE — Telephone Encounter (Signed)
Message left for the patient to call back for results.   

## 2017-02-24 NOTE — Progress Notes (Signed)
Patient ID: Stephanie Wade, female   DOB: 1965-10-19, 52 y.o.   MRN: [id number]  Chief Complaint  Patient presents with  . Follow-up    HPI Stephanie Wade is a 52 y.o. female.  Here for follow up let chest wall cyst excision done 02-17-17. She states she is doing well.  HPI  Past Medical History:  Diagnosis Date  . Adenomyosis   . Breast mass 2009/2010  . Menorrhagia   . Obesity (BMI 30.0-34.9)     Past Surgical History:  Procedure Laterality Date  . ABDOMINAL HYSTERECTOMY    . ANTERIOR CRUCIATE LIGAMENT REPAIR Left   . CARPAL TUNNEL RELEASE Bilateral 2011  . CERVICAL CONE BIOPSY  1988  . COLONOSCOPY WITH PROPOFOL N/A 06/04/2015   Procedure: COLONOSCOPY WITH PROPOFOL;  Surgeon: Christene Lye, MD;  Location: [facility] ENDOSCOPY;  Service: Endoscopy;  Laterality: N/A;  . CRYOTHERAPY  1987   of cervix  . DILATION AND CURETTAGE, DIAGNOSTIC / THERAPEUTIC  2011  . LAPAROSCOPIC SUPRACERVICAL HYSTERECTOMY  10/2009   LSH for adenomyosis Dr. Laurey Morale    Family History  Problem Relation Age of Onset  . Colon cancer Maternal Aunt 72  . Colon cancer Maternal Grandmother 33  . Alzheimer's disease Maternal Grandfather   . Diabetes Paternal Grandmother   . Heart disease Paternal Grandmother   . Hypertension Paternal Grandmother   . Heart attack Paternal Grandmother   . Diabetes Paternal Grandfather   . Kidney cancer Paternal Grandfather   . Hypertension Paternal Grandfather   . Melanoma Brother 41       on foot  . Pancreatic cancer Other 56  . Diabetes Maternal Uncle   . Breast cancer Neg Hx     Social History Social History   Tobacco Use  . Smoking status: Never Smoker  . Smokeless tobacco: Never Used  Substance Use Topics  . Alcohol use: Yes    Comment: 1-2 glasses wine/month  . Drug use: No    No Known Allergies  No current outpatient medications on file.   No current facility-administered medications for this visit.     Review of Systems Review of  Systems  Constitutional: Negative.   Respiratory: Negative.   Cardiovascular: Negative.     There were no vitals taken for this visit.  Physical Exam Physical Exam  Data Reviewed   Assessment        Plan    Follow up as needed. Aware of pathology.     Sutures removed steri strips applied. The patient is aware to call back for any questions or new concerns.   Karie Fetch M 02/24/2017, 8:19 AM

## 2017-02-24 NOTE — Telephone Encounter (Signed)
-----   Message from Robert Bellow, MD sent at 02/23/2017  8:37 PM EST ----- Notify path OK. F/u for wound check/ suture removal as scheduled.  ----- Message ----- From: Interface, Lab In [facility] Sent: 02/23/2017   5:59 PM To: Robert Bellow, MD

## 2017-02-24 NOTE — Patient Instructions (Signed)
The patient is aware to call back for any questions or concerns.  

## 2018-03-14 NOTE — Progress Notes (Signed)
Gynecology Annual Exam  PCP: Patient, No Pcp Per  Chief Complaint:  Chief Complaint  Patient presents with  . Gynecologic Exam    History of Present Illness: Stephanie Wade is a 53 y.o. G2P2002 who presents for her annual exam. The patient has had no vaginal bleeding. Hot flashes are a little better. Her menses are absent due to a [facility] 2011 for adenomyosis.  Last pap smear: 04/01/2015, results were NIL/neg   The patient is sexually active.  Has noticed a decrease in sex drive.   Since her last visit, she had an inflamed epidermal cyst removed from her left breast. She also had a basal cell carcinoma removed 6-8 mos ago  She has had a colonoscopy 06/04/2015. Results: polyps. Next colonoscopy due in 5 years. Her father died 2022/10/25 from colon cancer at the age of 30.  Her past medical history is remarkable for a remote history of abnormal Pap smears in the 1980s requiring a cryo and conization, adenomyosis,  and obesity (current BMI 34.11 kg/m2 )  The patient does perform self breast exams. Her last mammogram was 01/05/2017, results were negative.   There is no family history of breast cancer.   There is no family history of ovarian cancer.  The patient denies smoking.  She reports drinking 3 glasses of wine a week.   She denies illegal drug use.  The patient reports exercising regularly.  The patient denies current symptoms of depression.    Review of Systems: Review of Systems  Constitutional: Negative for chills, fever and weight loss.  HENT: Negative for congestion, sinus pain and sore throat.   Eyes: Negative for blurred vision and pain.  Respiratory: Negative for hemoptysis, shortness of breath and wheezing.   Cardiovascular: Negative for chest pain, palpitations and leg swelling.  Gastrointestinal: Negative for abdominal pain, blood in stool, diarrhea, heartburn, nausea and vomiting.  Genitourinary: Negative for dysuria, frequency, hematuria and urgency.    Musculoskeletal: Positive for joint pain (knees and hip). Negative for back pain and myalgias.  Skin: Negative for itching and rash.  Neurological: Negative for dizziness, tingling and headaches.  Endo/Heme/Allergies: Negative for environmental allergies and polydipsia. Does not bruise/bleed easily.       Positive for hot flashes and decreased sex drive.   Psychiatric/Behavioral: Negative for depression. The patient is not nervous/anxious and does not have insomnia.     Past Medical History:  Past Medical History:  Diagnosis Date  . Adenomyosis   . Basal cell carcinoma 2019  . Breast mass 2009/2010  . Menorrhagia   . Obesity (BMI 30.0-34.9)     Past Surgical History:  Past Surgical History:  Procedure Laterality Date  . ANTERIOR CRUCIATE LIGAMENT REPAIR Left   . CARPAL TUNNEL RELEASE Bilateral 2011  . CERVICAL CONE BIOPSY  1988  . COLONOSCOPY WITH PROPOFOL N/A 06/04/2015   Procedure: COLONOSCOPY WITH PROPOFOL;  Surgeon: Christene Lye, MD;  Location: [facility] ENDOSCOPY;  Service: Endoscopy;  Laterality: N/A;  . CRYOTHERAPY  1987   of cervix  . DILATION AND CURETTAGE, DIAGNOSTIC / THERAPEUTIC  2011  . LAPAROSCOPIC SUPRACERVICAL HYSTERECTOMY  10/2009   [place] for adenomyosis Dr. Laurey Morale  . LAPAROSCOPIC SUPRACERVICAL HYSTERECTOMY  2011   adenomyosis    Family History:  Family History  Problem Relation Age of Onset  . Colon cancer Maternal Aunt 72  . Colon cancer Maternal Grandmother 80  . Alzheimer's disease Maternal Grandfather   . Diabetes Paternal Grandmother   . Heart disease  Paternal Grandmother   . Hypertension Paternal Grandmother   . Heart attack Paternal Grandmother   . Diabetes Paternal Grandfather   . Kidney cancer Paternal Grandfather 30  . Hypertension Paternal Grandfather   . Melanoma Brother 41       on foot  . Pancreatic cancer Other 53  . Colon cancer Father 38  . Diabetes Maternal Uncle   . Breast cancer Neg Hx     Social History:  Social  History   Socioeconomic History  . Marital status: Divorced    Spouse name: Not on file  . Number of children: 2  . Years of education: Not on file  . Highest education level: Not on file  Occupational History  . Not on file  Social Needs  . Financial resource strain: Not on file  . Food insecurity:    Worry: Not on file    Inability: Not on file  . Transportation needs:    Medical: Not on file    Non-medical: Not on file  Tobacco Use  . Smoking status: Never Smoker  . Smokeless tobacco: Never Used  Substance and Sexual Activity  . Alcohol use: Yes    Comment: 1-2 glasses wine/month  . Drug use: No  . Sexual activity: Yes    Partners: Male    Birth control/protection: Surgical    Comment: Hysterectomy   Lifestyle  . Physical activity:    Days per week: Not on file    Minutes per session: Not on file  . Stress: Not on file  Relationships  . Social connections:    Talks on phone: Not on file    Gets together: Not on file    Attends religious service: Not on file    Active member of club or organization: Not on file    Attends meetings of clubs or organizations: Not on file    Relationship status: Not on file  . Intimate partner violence:    Fear of current or ex partner: Not on file    Emotionally abused: Not on file    Physically abused: Not on file    Forced sexual activity: Not on file  Other Topics Concern  . Not on file  Social History Narrative  . Not on file    Allergies:  No Known Allergies  Medications: none Physical Exam Vitals: BP 112/70   Pulse 92   Ht 5\' 5"  (1.651 m)   Wt 205 lb (93 kg)   BMI 34.11 kg/m   General: WF in NAD HEENT: normocephalic, anicteric Neck: no thyroid enlargement, no palpable nodules, no cervical lymphadenopathy  Pulmonary: No increased work of breathing, CTAB Cardiovascular: RRR, without murmur  Breast: Breast symmetrical, no tenderness, no palpable nodules or masses, no skin or nipple retraction present, no nipple  discharge.  No axillary, infraclavicular or supraclavicular lymphadenopathy.  Abdomen: Soft, non-tender, non-distended.  Umbilicus without lesions.  No hepatomegaly or masses palpable. No evidence of hernia. Genitourinary:  External: Normal external female genitalia.  Normal urethral meatus, normal Bartholin's and Skene's glands.    Vagina: Normal vaginal mucosa, no evidence of prolapse.    Cervix: Grossly normal in appearance, no bleeding, non-tender, stenotic  Uterus: surgically absent  Adnexa: No adnexal masses, non-tender  Rectal: deferred  Lymphatic: no evidence of inguinal lymphadenopathy Extremities: no edema, erythema, or tenderness Neurologic: Grossly intact Psychiatric: mood appropriate, affect full Skin: tanned, freckled back, chest, arms, legs, face.      Assessment: 53 y.o. [id number] well woman exam Last  TDAP >10 years ago  Plan:   1) Breast cancer screening - recommend monthly self breast exam and annual screening mammograms. Mammogram was ordered today.  2) Colon cancer screen-colonoscopy 2017.  Next one due 2022  3) Cervical cancer screening - Pap done.  Patient opts for 2 screening interval.   4) Routine healthcare maintenance including cholesterol and diabetes screening last done 2017. Next due in 2022. TDAP today. Shingrix at age 27.   96) Discussed calcium and vitamin D3 requirements. Recommend vitamin D3 supplement. Encouraged weight bearing exercise.   5) RTO 1 year for annual exam and prn  Dalia Heading, CNM

## 2018-03-15 ENCOUNTER — Other Ambulatory Visit (HOSPITAL_COMMUNITY)
Admission: RE | Admit: 2018-03-15 | Discharge: 2018-03-15 | Disposition: A | Discharge status: Home / Self Care | Payer: 59 | Source: Ambulatory Visit | Attending: Certified Nurse Midwife | Admitting: Certified Nurse Midwife

## 2018-03-15 ENCOUNTER — Encounter: Payer: Self-pay | Admitting: Certified Nurse Midwife

## 2018-03-15 ENCOUNTER — Ambulatory Visit (INDEPENDENT_AMBULATORY_CARE_PROVIDER_SITE_OTHER): Payer: 59 | Admitting: Certified Nurse Midwife

## 2018-03-15 VITALS — BP 112/70 | HR 92 | Ht 65.0 in | Wt 205.0 lb

## 2018-03-15 DIAGNOSIS — Z23 Encounter for immunization: Secondary | ICD-10-CM

## 2018-03-15 DIAGNOSIS — Z124 Encounter for screening for malignant neoplasm of cervix: Secondary | ICD-10-CM

## 2018-03-15 DIAGNOSIS — Z01419 Encounter for gynecological examination (general) (routine) without abnormal findings: Secondary | ICD-10-CM | POA: Diagnosis not present

## 2018-03-15 DIAGNOSIS — C4491 Basal cell carcinoma of skin, unspecified: Secondary | ICD-10-CM

## 2018-03-15 DIAGNOSIS — Z1239 Encounter for other screening for malignant neoplasm of breast: Secondary | ICD-10-CM

## 2018-03-17 LAB — CYTOLOGY - PAP
Diagnosis: NEGATIVE
HPV: NOT DETECTED

## 2018-03-20 ENCOUNTER — Encounter: Payer: Self-pay | Admitting: Certified Nurse Midwife

## 2018-03-20 DIAGNOSIS — C4491 Basal cell carcinoma of skin, unspecified: Secondary | ICD-10-CM | POA: Insufficient documentation

## 2018-03-28 ENCOUNTER — Ambulatory Visit
Admission: RE | Admit: 2018-03-28 | Discharge: 2018-03-28 | Disposition: A | Discharge status: Home / Self Care | Payer: 59 | Source: Ambulatory Visit | Attending: Certified Nurse Midwife | Admitting: Certified Nurse Midwife

## 2018-03-28 ENCOUNTER — Other Ambulatory Visit: Payer: Self-pay

## 2018-03-28 DIAGNOSIS — Z1239 Encounter for other screening for malignant neoplasm of breast: Secondary | ICD-10-CM

## 2018-03-28 DIAGNOSIS — Z1231 Encounter for screening mammogram for malignant neoplasm of breast: Secondary | ICD-10-CM | POA: Diagnosis not present

## 2018-04-20 DIAGNOSIS — M25562 Pain in left knee: Secondary | ICD-10-CM | POA: Insufficient documentation

## 2018-08-31 ENCOUNTER — Encounter: Payer: Self-pay | Admitting: General Surgery

## 2019-06-13 NOTE — Progress Notes (Signed)
Gynecology Annual Exam  PCP: Patient, No Pcp Per  Chief Complaint:  Chief Complaint  Patient presents with  . Gynecologic Exam    History of Present Illness: Saniyya Neri is a 54 y.o. G2P2002 who presents for her annual exam. The patient has had no vaginal bleeding. Hot flashes mostly occur at night Her menses are absent due to a [facility] 25-Apr-2009 for adenomyosis.  Last pap smear: 03/15/2018, results were NIL/neg   The patient is sexually active.   Since her last visit, she has had no significant changes in her health. She does have concerns regarding right elbow pain x 6 months and a trigger finger of her right ring finger x 3 mos. She has taken NSAIDs and applied topical Voltaren for her elbow pain with little relief. She does have a hx of having carpal tunnel surgery on both wrists.  She has had a colonoscopy 06/04/2015. Results: polyps. Next colonoscopy due in 5 years 2020-04-25). Her father died Oct 22, 2022 from colon cancer at the age of 20.  Her past medical history is remarkable for a remote history of abnormal Pap smears in the 1980s requiring a cryo and conization, adenomyosis, BCC,  and obesity (current BMI 34.28 kg/m2 )  The patient does perform self breast exams. Her last mammogram was 03/28/2018, results were negative.   There is no family history of breast cancer.   There is no family history of ovarian cancer.  The patient denies smoking.  She reports drinking 3-4 glasses of wine a week.   She denies illegal drug use.  The patient reports exercising regularly.  The patient denies current symptoms of depression.   She had a total cholesterol check at work in 26-Apr-2018 that was 230.   Review of Systems: Review of Systems  Constitutional: Negative for chills, fever and weight loss.  HENT: Negative for congestion, sinus pain and sore throat.   Eyes: Negative for blurred vision and pain.  Respiratory: Negative for hemoptysis, shortness of breath and wheezing.    Cardiovascular: Negative for chest pain, palpitations and leg swelling.  Gastrointestinal: Negative for abdominal pain, blood in stool, diarrhea, heartburn, nausea and vomiting.  Genitourinary: Negative for dysuria, frequency, hematuria and urgency.  Musculoskeletal: Positive for joint pain (right elbow). Negative for back pain and myalgias.  Skin: Negative for itching and rash.  Neurological: Negative for dizziness, tingling and headaches.  Endo/Heme/Allergies: Positive for environmental allergies (and sneezing). Negative for polydipsia. Does not bruise/bleed easily.       Positive for hot flashes.   Psychiatric/Behavioral: Negative for depression. The patient is not nervous/anxious and does not have insomnia.     Past Medical History:  Past Medical History:  Diagnosis Date  . Adenomyosis   . Basal cell carcinoma Apr 25, 2017  . Breast mass 2009/2010  . Menorrhagia   . Obesity (BMI 30.0-34.9)     Past Surgical History:  Past Surgical History:  Procedure Laterality Date  . ANTERIOR CRUCIATE LIGAMENT REPAIR Left   . CARPAL TUNNEL RELEASE Bilateral Apr 25, 2009  . CERVICAL CONE BIOPSY  1988  . COLONOSCOPY WITH PROPOFOL N/A 06/04/2015   Procedure: COLONOSCOPY WITH PROPOFOL;  Surgeon: Christene Lye, MD;  Location: [facility] ENDOSCOPY;  Service: Endoscopy;  Laterality: N/A;  . CRYOTHERAPY  1987   of cervix  . DILATION AND CURETTAGE, DIAGNOSTIC / THERAPEUTIC  04-25-09  . LAPAROSCOPIC SUPRACERVICAL HYSTERECTOMY  10/2009   [facility] for adenomyosis Dr. Laurey Morale    Family History:  Family History  Problem Relation Age  of Onset  . Colon cancer Maternal Aunt 72  . Colon cancer Maternal Grandmother 62  . Alzheimer's disease Maternal Grandfather   . Diabetes Paternal Grandmother   . Heart disease Paternal Grandmother   . Hypertension Paternal Grandmother   . Heart attack Paternal Grandmother   . Diabetes Paternal Grandfather   . Kidney cancer Paternal Grandfather 77  . Hypertension Paternal Grandfather    . Melanoma Brother 41       on foot  . Pancreatic cancer Other 19  . Colon cancer Father 26  . Diabetes Maternal Uncle   . Breast cancer Neg Hx     Social History:  Social History   Socioeconomic History  . Marital status: Divorced    Spouse name: Not on file  . Number of children: 2  . Years of education: Not on file  . Highest education level: Not on file  Occupational History  . Not on file  Tobacco Use  . Smoking status: Never Smoker  . Smokeless tobacco: Never Used  Substance and Sexual Activity  . Alcohol use: Yes    Comment: 1-2 glasses wine/month  . Drug use: No  . Sexual activity: Yes    Partners: Male    Birth control/protection: Surgical    Comment: Hysterectomy   Other Topics Concern  . Not on file  Social History Narrative  . Not on file   Social Determinants of Health   Financial Resource Strain:   . Difficulty of Paying Living Expenses:   Food Insecurity:   . Worried About Charity fundraiser in the Last Year:   . Arboriculturist in the Last Year:   Transportation Needs:   . Film/video editor (Medical):   Marland Kitchen Lack of Transportation (Non-Medical):   Physical Activity:   . Days of Exercise per Week:   . Minutes of Exercise per Session:   Stress:   . Feeling of Stress :   Social Connections:   . Frequency of Communication with Friends and Family:   . Frequency of Social Gatherings with Friends and Family:   . Attends Religious Services:   . Active Member of Clubs or Organizations:   . Attends Archivist Meetings:   Marland Kitchen Marital Status:   Intimate Partner Violence:   . Fear of Current or Ex-Partner:   . Emotionally Abused:   Marland Kitchen Physically Abused:   . Sexually Abused:     Allergies:  No Known Allergies  Medications: calcium +vitamin D3, vitamin C occasionally Physical Exam Vitals: BP 120/70   Ht 5\' 5"  (1.651 m)   Wt 206 lb (93.4 kg)   BMI 34.28 kg/m   General: WF in NAD HEENT: normocephalic, anicteric Neck: no thyroid  enlargement, no palpable nodules, no cervical lymphadenopathy  Pulmonary: No increased work of breathing, CTAB Cardiovascular: RRR, without murmur  Breast: Breast symmetrical, no tenderness, no palpable nodules or masses, no skin or nipple retraction present, no nipple discharge.  No axillary, infraclavicular or supraclavicular lymphadenopathy.  Abdomen: Soft, non-tender, non-distended.  Umbilicus without lesions.  No hepatomegaly or masses palpable. No evidence of hernia. Genitourinary:  External: Normal external female genitalia.  Normal urethral meatus, normal Bartholin's and Skene's glands.    Vagina: Normal vaginal mucosa, no evidence of prolapse.    Cervix: Grossly normal in appearance, no bleeding, non-tender, stenotic  Uterus: surgically absent  Adnexa: No adnexal masses, non-tender  Rectal: deferred  Lymphatic: no evidence of inguinal lymphadenopathy Extremities: no edema, erythema, or tenderness Neurologic:  Grossly intact Psychiatric: mood appropriate, affect full      Assessment: 54 y.o. [id number] well woman exam Right elbow pain Trigger finger right ring finger  Plan:   1) Breast cancer screening - recommend monthly self breast exam and annual screening mammograms. Mammogram was ordered today.  2) Colon cancer screen-colonoscopy 2017.  Next one due 2022  3) Cervical cancer screening - Pap not done.  Patient opts for 2 screening interval.   4) Routine healthcare maintenance including cholesterol and diabetes screening. Last total cholesterol borderline elevated. Recommend fasting lipid panel and hemoglobin A1C. Will return for lab appointment  5) Discussed calcium and vitamin D3 requirements. Recommend vitamin D3 supplement. Encouraged weight bearing exercise.   6) [facility] consult  7) RTO 1 year for annual exam and prn  Dalia Heading, CNM

## 2019-06-14 ENCOUNTER — Other Ambulatory Visit: Payer: Self-pay

## 2019-06-14 ENCOUNTER — Ambulatory Visit (INDEPENDENT_AMBULATORY_CARE_PROVIDER_SITE_OTHER): Payer: No Typology Code available for payment source | Admitting: Certified Nurse Midwife

## 2019-06-14 ENCOUNTER — Encounter: Payer: Self-pay | Admitting: Certified Nurse Midwife

## 2019-06-14 VITALS — BP 120/70 | Ht 65.0 in | Wt 206.0 lb

## 2019-06-14 DIAGNOSIS — Z13220 Encounter for screening for lipoid disorders: Secondary | ICD-10-CM

## 2019-06-14 DIAGNOSIS — M25521 Pain in right elbow: Secondary | ICD-10-CM

## 2019-06-14 DIAGNOSIS — Z1231 Encounter for screening mammogram for malignant neoplasm of breast: Secondary | ICD-10-CM

## 2019-06-14 DIAGNOSIS — Z131 Encounter for screening for diabetes mellitus: Secondary | ICD-10-CM

## 2019-06-14 DIAGNOSIS — M65341 Trigger finger, right ring finger: Secondary | ICD-10-CM | POA: Insufficient documentation

## 2019-06-14 DIAGNOSIS — Z01419 Encounter for gynecological examination (general) (routine) without abnormal findings: Secondary | ICD-10-CM

## 2019-06-14 DIAGNOSIS — G8929 Other chronic pain: Secondary | ICD-10-CM

## 2019-07-12 ENCOUNTER — Ambulatory Visit
Admission: RE | Admit: 2019-07-12 | Discharge: 2019-07-12 | Disposition: A | Discharge status: Home / Self Care | Payer: No Typology Code available for payment source | Source: Ambulatory Visit | Attending: Certified Nurse Midwife | Admitting: Certified Nurse Midwife

## 2019-07-12 ENCOUNTER — Other Ambulatory Visit: Payer: Self-pay

## 2019-07-12 DIAGNOSIS — Z1231 Encounter for screening mammogram for malignant neoplasm of breast: Secondary | ICD-10-CM | POA: Diagnosis not present

## 2020-03-18 ENCOUNTER — Telehealth: Payer: Self-pay

## 2020-03-18 DIAGNOSIS — Z1211 Encounter for screening for malignant neoplasm of colon: Secondary | ICD-10-CM

## 2020-03-18 NOTE — Telephone Encounter (Signed)
Called pt, no answer, LVMTRC. 

## 2020-03-18 NOTE — Telephone Encounter (Signed)
Pt calling; has scheduled annual for 5/23rd; would like for order for mammogram and colonoscopy to be put in so they won't be delayed.  [phone]

## 2020-03-18 NOTE — Telephone Encounter (Signed)
Pt's mammo not due till 6/22. [facility] usually won't schedule till after she has had a breast exam at her annual. Her colonoscopy is due 5/22. She can call her prior GI directly to schedule since she is in their system and due.

## 2020-03-19 NOTE — Telephone Encounter (Signed)
Pt aware.

## 2020-03-19 NOTE — Telephone Encounter (Signed)
Patient contacted [facility]. She does need a referral for colonoscopy. Patient asked to be notified when referral is in and she will call to schedule her apt.

## 2020-03-19 NOTE — Telephone Encounter (Signed)
I have placed order, needs to be released by kelly. [facility] will call pt with appt info.

## 2020-03-25 ENCOUNTER — Other Ambulatory Visit: Payer: Self-pay

## 2020-03-25 ENCOUNTER — Telehealth (INDEPENDENT_AMBULATORY_CARE_PROVIDER_SITE_OTHER): Payer: Self-pay | Admitting: Gastroenterology

## 2020-03-25 DIAGNOSIS — Z860100 Personal history of colon polyps, unspecified: Secondary | ICD-10-CM

## 2020-03-25 DIAGNOSIS — Z800 Family history of malignant neoplasm of digestive organs: Secondary | ICD-10-CM

## 2020-03-25 MED ORDER — GOLYTELY 236 G PO SOLR: 4000.0000 mL | Freq: Once | ORAL | 0 refills | Status: AC

## 2020-03-25 NOTE — Progress Notes (Signed)
Gastroenterology Pre-Procedure Review  Request Date: Monday 06/09/20 Requesting Physician: Dr. Allen Norris  PATIENT REVIEW QUESTIONS: The patient responded to the following health history questions as indicated:    1. Are you having any GI issues? no 2. Do you have a personal history of Polyps? yes (last colonoscopy performed by Dr. Jamal Collin 06/04/15 polyps were noted) 3. Do you have a family history of Colon Cancer or Polyps? yes (yes father and maternal grandmother colon cancer) 22. Diabetes Mellitus? no 5. Joint replacements in the past 12 months?no 6. Major health problems in the past 3 months?no 7. Any artificial heart valves, MVP, or defibrillator?no    MEDICATIONS & ALLERGIES:    Patient reports the following regarding taking any anticoagulation/antiplatelet therapy:   Plavix, Coumadin, Eliquis, Xarelto, Lovenox, Pradaxa, Brilinta, or Effient? no Aspirin? no  Patient confirms/reports the following medications:  Current Outpatient Medications  Medication Sig Dispense Refill  . polyethylene glycol (GOLYTELY) 236 g solution Take 4,000 mLs by mouth once for 1 dose. Fill Golytely container to the fill line with clear liquid.  Mix well.  Drink 8 oz every 30 minutes until entire contents have been completed. 4000 mL 0   No current facility-administered medications for this visit.    Patient confirms/reports the following allergies:  No Known Allergies  Orders Placed This Encounter  Procedures  . Procedural/ Surgical Case Request: COLONOSCOPY WITH PROPOFOL    Standing Status:   Standing    Number of Occurrences:   1    Order Specific Question:   Pre-op diagnosis    Answer:   personal history of colon polyps, family history of colon cancer father    Order Specific Question:   CPT Code    Answer:   [id number]    AUTHORIZATION INFORMATION Primary Insurance: 1D#: Group #:  Secondary Insurance: 1D#: Group #:  SCHEDULE INFORMATION: Date: Monday 06/09/20 Time: Location:[facility]

## 2020-04-30 ENCOUNTER — Telehealth: Payer: Self-pay

## 2020-04-30 NOTE — Telephone Encounter (Signed)
LVM for pt to call clinic of a referral to GI for her 5 year colonoscopy.

## 2020-05-25 DIAGNOSIS — K635 Polyp of colon: Secondary | ICD-10-CM

## 2020-05-25 HISTORY — DX: Polyp of colon: K63.5

## 2020-06-02 ENCOUNTER — Encounter: Payer: Self-pay | Admitting: Gastroenterology

## 2020-06-06 NOTE — Discharge Instructions (Signed)

## 2020-06-09 ENCOUNTER — Ambulatory Visit: Payer: 59 | Admitting: Anesthesiology

## 2020-06-09 ENCOUNTER — Other Ambulatory Visit: Payer: Self-pay

## 2020-06-09 ENCOUNTER — Encounter
Admission: RE | Disposition: A | Discharge status: Home / Self Care | Payer: Self-pay | Source: Ambulatory Visit | Attending: Gastroenterology

## 2020-06-09 ENCOUNTER — Ambulatory Visit
Admission: RE | Admit: 2020-06-09 | Discharge: 2020-06-09 | Disposition: A | Discharge status: Home / Self Care | Payer: 59 | Source: Ambulatory Visit | Attending: Gastroenterology | Admitting: Gastroenterology

## 2020-06-09 ENCOUNTER — Encounter: Payer: Self-pay | Admitting: Gastroenterology

## 2020-06-09 DIAGNOSIS — Z808 Family history of malignant neoplasm of other organs or systems: Secondary | ICD-10-CM | POA: Diagnosis not present

## 2020-06-09 DIAGNOSIS — Z860100 Personal history of colon polyps, unspecified: Secondary | ICD-10-CM

## 2020-06-09 DIAGNOSIS — D122 Benign neoplasm of ascending colon: Secondary | ICD-10-CM | POA: Insufficient documentation

## 2020-06-09 DIAGNOSIS — Z800 Family history of malignant neoplasm of digestive organs: Secondary | ICD-10-CM

## 2020-06-09 DIAGNOSIS — K5730 Diverticulosis of large intestine without perforation or abscess without bleeding: Secondary | ICD-10-CM | POA: Diagnosis not present

## 2020-06-09 DIAGNOSIS — Z6833 Body mass index (BMI) 33.0-33.9, adult: Secondary | ICD-10-CM | POA: Insufficient documentation

## 2020-06-09 DIAGNOSIS — K640 First degree hemorrhoids: Secondary | ICD-10-CM | POA: Diagnosis not present

## 2020-06-09 DIAGNOSIS — Z8051 Family history of malignant neoplasm of kidney: Secondary | ICD-10-CM | POA: Diagnosis not present

## 2020-06-09 DIAGNOSIS — E669 Obesity, unspecified: Secondary | ICD-10-CM | POA: Insufficient documentation

## 2020-06-09 DIAGNOSIS — Z1211 Encounter for screening for malignant neoplasm of colon: Secondary | ICD-10-CM | POA: Diagnosis present

## 2020-06-09 DIAGNOSIS — K635 Polyp of colon: Secondary | ICD-10-CM | POA: Diagnosis not present

## 2020-06-09 DIAGNOSIS — D123 Benign neoplasm of transverse colon: Secondary | ICD-10-CM | POA: Insufficient documentation

## 2020-06-09 HISTORY — DX: Other specified postprocedural states: Z98.890

## 2020-06-09 HISTORY — PX: COLONOSCOPY WITH PROPOFOL: SHX5780

## 2020-06-09 HISTORY — PX: POLYPECTOMY: SHX5525

## 2020-06-09 HISTORY — DX: Nausea with vomiting, unspecified: R11.2

## 2020-06-09 SURGERY — COLONOSCOPY WITH PROPOFOL
Anesthesia: General | Site: Rectum

## 2020-06-09 MED ORDER — STERILE WATER FOR IRRIGATION IR SOLN: Status: DC | PRN

## 2020-06-09 MED ORDER — PROPOFOL 10 MG/ML IV BOLUS
INTRAVENOUS | Status: DC | PRN
  Administered 2020-06-09: 150 mg via INTRAVENOUS
  Administered 2020-06-09 (×2): 40 mg via INTRAVENOUS
  Administered 2020-06-09: 30 mg via INTRAVENOUS
  Administered 2020-06-09: 40 mg via INTRAVENOUS
  Administered 2020-06-09: 30 mg via INTRAVENOUS

## 2020-06-09 MED ORDER — ACETAMINOPHEN 160 MG/5ML PO SOLN
325.0000 mg | ORAL | Status: DC | PRN
Start: 2020-06-09 — End: 2020-06-09

## 2020-06-09 MED ORDER — ONDANSETRON HCL 4 MG/2ML IJ SOLN: 4.0000 mg | Freq: Once | INTRAMUSCULAR | Status: DC | PRN

## 2020-06-09 MED ORDER — LIDOCAINE HCL (CARDIAC) PF 100 MG/5ML IV SOSY
PREFILLED_SYRINGE | INTRAVENOUS | Status: DC | PRN
  Administered 2020-06-09: 30 mg via INTRAVENOUS

## 2020-06-09 MED ORDER — LACTATED RINGERS IV SOLN: INTRAVENOUS | Status: DC

## 2020-06-09 MED ORDER — ACETAMINOPHEN 325 MG PO TABS: 325.0000 mg | ORAL_TABLET | ORAL | Status: DC | PRN

## 2020-06-09 MED ORDER — SODIUM CHLORIDE 0.9 % IV SOLN: INTRAVENOUS | Status: DC

## 2020-06-09 SURGICAL SUPPLY — 7 items
FORCEPS BIOP RAD 4 LRG CAP 4 (CUTTING FORCEPS) ×2 IMPLANT
GOWN CVR UNV OPN BCK APRN NK (MISCELLANEOUS) ×2 IMPLANT
GOWN ISOL THUMB LOOP REG UNIV (MISCELLANEOUS) ×4
KIT PRC NS LF DISP ENDO (KITS) ×1 IMPLANT
KIT PROCEDURE OLYMPUS (KITS) ×2
MANIFOLD NEPTUNE II (INSTRUMENTS) ×2 IMPLANT
WATER STERILE IRR 250ML POUR (IV SOLUTION) ×2 IMPLANT

## 2020-06-09 NOTE — Anesthesia Procedure Notes (Signed)
Date/Time: 06/09/2020 7:47 AM Performed by: Cameron Ali, CRNA Pre-anesthesia Checklist: Patient identified, Emergency Drugs available, Suction available, Timeout performed and Patient being monitored Patient Re-evaluated:Patient Re-evaluated prior to induction Oxygen Delivery Method: Nasal cannula Placement Confirmation: positive ETCO2

## 2020-06-09 NOTE — Transfer of Care (Signed)
Immediate Anesthesia Transfer of Care Note  Patient: Stephanie Wade  Procedure(s) Performed: COLONOSCOPY WITH BIOPSY (N/A Rectum) POLYPECTOMY (N/A Rectum)  Patient Location: PACU  Anesthesia Type: General  Level of Consciousness: awake, alert  and patient cooperative  Airway and Oxygen Therapy: Patient Spontanous Breathing and Patient connected to supplemental oxygen  Post-op Assessment: Post-op Vital signs reviewed, Patient's Cardiovascular Status Stable, Respiratory Function Stable, Patent Airway and No signs of Nausea or vomiting  Post-op Vital Signs: Reviewed and stable  Complications: No complications documented.

## 2020-06-09 NOTE — Anesthesia Preprocedure Evaluation (Signed)
Anesthesia Evaluation  Patient identified by MRN, date of birth, ID band Patient awake    Reviewed: Allergy & Precautions, NPO status   History of Anesthesia Complications (+) PONV  Airway Mallampati: II  TM Distance: >3 FB     Dental   Pulmonary neg pulmonary ROS,    Pulmonary exam normal        Cardiovascular negative cardio ROS   Rhythm:Regular Rate:Normal     Neuro/Psych    GI/Hepatic   Endo/Other  Obesity - BMI 33  Renal/GU      Musculoskeletal   Abdominal   Peds  Hematology   Anesthesia Other Findings   Reproductive/Obstetrics                             Anesthesia Physical Anesthesia Plan  ASA: II  Anesthesia Plan: General   Post-op Pain Management:    Induction: Intravenous  PONV Risk Score and Plan: Propofol infusion, TIVA and Treatment may vary due to age or medical condition  Airway Management Planned: Natural Airway and Nasal Cannula  Additional Equipment:   Intra-op Plan:   Post-operative Plan:   Informed Consent: I have reviewed the patients History and Physical, chart, labs and discussed the procedure including the risks, benefits and alternatives for the proposed anesthesia with the patient or authorized representative who has indicated his/her understanding and acceptance.       Plan Discussed with: CRNA  Anesthesia Plan Comments:         Anesthesia Quick Evaluation

## 2020-06-09 NOTE — Op Note (Signed)
[facility] Gastroenterology Patient Name: Stephanie Wade Procedure Date: 06/09/2020 7:38 AM MRN: [id number] Account #: [id number] Date of Birth: 11/12/1965 Admit Type: Outpatient Age: 55 Room: [facility] OR ROOM 01 Gender: Female Note Status: Finalized Procedure:             Colonoscopy Indications:           High risk colon cancer surveillance: Personal history                         of colonic polyps Providers:             Lucilla Lame MD, MD Referring MD:          [facility] Medicines:             Propofol per Anesthesia Complications:         No immediate complications. Procedure:             Pre-Anesthesia Assessment:                        - Prior to the procedure, a History and Physical was                         performed, and patient medications and allergies were                         reviewed. The patient's tolerance of previous                         anesthesia was also reviewed. The risks and benefits                         of the procedure and the sedation options and risks                         were discussed with the patient. All questions were                         answered, and informed consent was obtained. Prior                         Anticoagulants: The patient has taken no previous                         anticoagulant or antiplatelet agents. ASA Grade                         Assessment: II - A patient with mild systemic disease.                         After reviewing the risks and benefits, the patient                         was deemed in satisfactory condition to undergo the                         procedure.                        After obtaining  informed consent, the colonoscope was                         passed under direct vision. Throughout the procedure,                         the patient's blood pressure, pulse, and oxygen                         saturations were monitored continuously. The was                          introduced through the anus and advanced to the the                         cecum, identified by appendiceal orifice and ileocecal                         valve. The colonoscopy was performed without                         difficulty. The patient tolerated the procedure well.                         The quality of the bowel preparation was excellent. Findings:      The perianal and digital rectal examinations were normal.      A 4 mm polyp was found in the ascending colon. The polyp was sessile.       The polyp was removed with a cold biopsy forceps. Resection and       retrieval were complete.      A 4 mm polyp was found in the transverse colon. The polyp was sessile.       The polyp was removed with a cold biopsy forceps. Resection and       retrieval were complete.      Multiple small-mouthed diverticula were found in the sigmoid colon.      Non-bleeding internal hemorrhoids were found during retroflexion. The       hemorrhoids were Grade I (internal hemorrhoids that do not prolapse). Impression:            - One 4 mm polyp in the ascending colon, removed with                         a cold biopsy forceps. Resected and retrieved.                        - One 4 mm polyp in the transverse colon, removed with                         a cold biopsy forceps. Resected and retrieved.                        - Diverticulosis in the sigmoid colon.                        - Non-bleeding internal hemorrhoids. Recommendation:        - Discharge patient to home.                        -  Resume previous diet.                        - Continue present medications.                        - Await pathology results.                        - Repeat colonoscopy in 5 years for surveillance. Procedure Code(s):     --- Professional ---                        [id number], Colonoscopy, flexible; with biopsy, single or                         multiple Diagnosis Code(s):     --- Professional ---                         Z86.010, Personal history of colonic polyps CPT copyright 2019 American Medical Association. All rights reserved. The codes documented in this report are preliminary and upon coder review may  be revised to meet current compliance requirements. Lucilla Lame MD, MD 06/09/2020 8:06:55 AM This report has been signed electronically. Number of Addenda: 0 Note Initiated On: 06/09/2020 7:38 AM Scope Withdrawal Time: 0 hours 7 minutes 19 seconds  Total Procedure Duration: 0 hours 11 minutes 45 seconds  Estimated Blood Loss:  Estimated blood loss: none.      [facility]

## 2020-06-09 NOTE — Anesthesia Postprocedure Evaluation (Signed)
Anesthesia Post Note  Patient: Stephanie Wade  Procedure(s) Performed: COLONOSCOPY WITH BIOPSY (N/A Rectum) POLYPECTOMY (N/A Rectum)     Patient location during evaluation: PACU Anesthesia Type: General Level of consciousness: awake Pain management: pain level controlled Vital Signs Assessment: post-procedure vital signs reviewed and stable Respiratory status: respiratory function stable Cardiovascular status: stable Postop Assessment: no signs of nausea or vomiting Anesthetic complications: no   No complications documented.  Veda Canning

## 2020-06-09 NOTE — H&P (Signed)
Lucilla Lame, MD [facility] [address] [place], [place] [address] Phone:[phone] Fax : [phone]  Primary Care Physician:  [facility], [place] Primary Gastroenterologist:  Dr. Allen Norris  Pre-Procedure History & Physical: HPI:  Stephanie Wade is a 55 y.o. female is here for an colonoscopy.   Past Medical History:  Diagnosis Date  . Adenomyosis   . Basal cell carcinoma 2019  . Breast mass 2009/2010  . Menorrhagia   . Obesity (BMI 30.0-34.9)   . PONV (postoperative nausea and vomiting)     Past Surgical History:  Procedure Laterality Date  . ABDOMINAL HYSTERECTOMY    . ANTERIOR CRUCIATE LIGAMENT REPAIR Left   . CARPAL TUNNEL RELEASE Bilateral 2011  . CERVICAL CONE BIOPSY  1988  . COLONOSCOPY WITH PROPOFOL N/A 06/04/2015   Procedure: COLONOSCOPY WITH PROPOFOL;  Surgeon: Christene Lye, MD;  Location: [facility] ENDOSCOPY;  Service: Endoscopy;  Laterality: N/A;  . CRYOTHERAPY  1987   of cervix  . DILATION AND CURETTAGE, DIAGNOSTIC / THERAPEUTIC  2011  . LAPAROSCOPIC SUPRACERVICAL HYSTERECTOMY  10/2009   [facility] for adenomyosis Dr. Laurey Morale    Prior to Admission medications   Not on File    Allergies as of 03/25/2020  . (No Known Allergies)    Family History  Problem Relation Age of Onset  . Colon cancer Maternal Aunt 72  . Colon cancer Maternal Grandmother 67  . Alzheimer's disease Maternal Grandfather   . Diabetes Paternal Grandmother   . Heart disease Paternal Grandmother   . Hypertension Paternal Grandmother   . Heart attack Paternal Grandmother   . Diabetes Paternal Grandfather   . Kidney cancer Paternal Grandfather 29  . Hypertension Paternal Grandfather   . Melanoma Brother 41       on foot  . Pancreatic cancer Other 12  . Colon cancer Father 68  . Diabetes Maternal Uncle   . Breast cancer Neg Hx     Social History   Socioeconomic History  . Marital status: Divorced    Spouse name: Not on file  . Number of children: 2  . Years of  education: Not on file  . Highest education level: Not on file  Occupational History  . Not on file  Tobacco Use  . Smoking status: Never Smoker  . Smokeless tobacco: Never Used  Vaping Use  . Vaping Use: Never used  Substance and Sexual Activity  . Alcohol use: Yes    Alcohol/week: 3.0 standard drinks    Types: 3 Glasses of wine per week    Comment: 1-2 glasses wine/month  . Drug use: No  . Sexual activity: Yes    Partners: Male    Birth control/protection: Surgical    Comment: Hysterectomy   Other Topics Concern  . Not on file  Social History Narrative  . Not on file   Social Determinants of Health   Financial Resource Strain: Not on file  Food Insecurity: Not on file  Transportation Needs: Not on file  Physical Activity: Not on file  Stress: Not on file  Social Connections: Not on file  Intimate Partner Violence: Not on file    Review of Systems: See HPI, otherwise negative ROS  Physical Exam: BP 114/85   Pulse 69   Temp 97.6 F (36.4 C) (Temporal)   Resp 16   Ht 5\' 5"  (1.651 m)   Wt 94.8 kg   SpO2 96%   BMI 34.78 kg/m  General:   Alert,  pleasant and cooperative in NAD  Head:  Normocephalic and atraumatic. Neck:  Supple; no masses or thyromegaly. Lungs:  Clear throughout to auscultation.    Heart:  Regular rate and rhythm. Abdomen:  Soft, nontender and nondistended. Normal bowel sounds, without guarding, and without rebound.   Neurologic:  Alert and  oriented x4;  grossly normal neurologically.  Impression/Plan: Stephanie Wade is here for an colonoscopy to be performed for a history of adenomatous polyps on 05/2015    Risks, benefits, limitations, and alternatives regarding  colonoscopy have been reviewed with the patient.  Questions have been answered.  All parties agreeable.   Lucilla Lame, MD  06/09/2020, 7:40 AM

## 2020-06-10 ENCOUNTER — Encounter: Payer: Self-pay | Admitting: Gastroenterology

## 2020-06-10 LAB — SURGICAL PATHOLOGY

## 2020-06-12 ENCOUNTER — Encounter: Payer: Self-pay | Admitting: Gastroenterology

## 2020-06-12 ENCOUNTER — Encounter: Payer: Self-pay | Admitting: Obstetrics and Gynecology

## 2020-06-12 NOTE — Progress Notes (Signed)
PCP: [facility]   Chief Complaint  Patient presents with  . Gynecologic Exam    No concerns    HPI:      [name] is a 55 y.o. [id number] whose LMP was No LMP recorded. Patient has had a hysterectomy., presents today for her annual examination.  Her menses are absent due to [facility] due to adenomyosis 2011. No PMB. She does not have vasomotor sx.   Sex activity: not sexually active. She does not have vaginal dryness.  Last Pap: 03/15/18  Results were: no abnormalities /neg HPV DNA.  Hx of STDs: HPV; hx of cone bx many yrs ago  Last mammogram: 07/12/19  Results were: normal--routine follow-up in 12 months There is no FH of breast cancer. There is no FH of ovarian cancer. The patient does do self-breast exams.  Colonoscopy: 5/22 with Dr. Allen Norris with polyps;  Repeat due after 5 years. Strong FH colon cancer on both sides  Tobacco use: The patient denies current or previous tobacco use. Alcohol use: social drinker  No drug use Exercise: moderately active  She does get adequate calcium but not Vitamin D in her diet.  No recent labs done. Has strong FH DM. Didn't do labs last yr. Not fasting today.    Past Medical History:  Diagnosis Date  . Adenomyosis   . Basal cell carcinoma 2019  . Breast mass 2009/2010  . Colon polyps 05/2020   on colonoscopy; repeat after 5 yrs  . Menorrhagia   . Obesity (BMI 30.0-34.9)   . PONV (postoperative nausea and vomiting)     Past Surgical History:  Procedure Laterality Date  . ABDOMINAL HYSTERECTOMY    . ANTERIOR CRUCIATE LIGAMENT REPAIR Left   . CARPAL TUNNEL RELEASE Bilateral 2011  . CERVICAL CONE BIOPSY  1988  . COLONOSCOPY WITH PROPOFOL N/A 06/04/2015   Procedure: COLONOSCOPY WITH PROPOFOL;  Surgeon: Christene Lye, MD;  Location: [facility];  Service: Endoscopy;  Laterality: N/A;  . COLONOSCOPY WITH PROPOFOL N/A 06/09/2020   Procedure: COLONOSCOPY WITH BIOPSY;  Surgeon: Lucilla Lame, MD;  Location:  [place];  Service: Endoscopy;  Laterality: N/A;  priority 4  . CRYOTHERAPY  1987   of cervix  . DILATION AND CURETTAGE, DIAGNOSTIC / THERAPEUTIC  2011  . LAPAROSCOPIC SUPRACERVICAL HYSTERECTOMY  10/2009   [facility] for adenomyosis Dr. Laurey Morale  . POLYPECTOMY N/A 06/09/2020   Procedure: POLYPECTOMY;  Surgeon: Lucilla Lame, MD;  Location: [place];  Service: Endoscopy;  Laterality: N/A;    Family History  Problem Relation Age of Onset  . Colon cancer Maternal Aunt 72  . Colon cancer Maternal Grandmother 21  . Alzheimer's disease Maternal Grandfather   . Diabetes Paternal Grandmother   . Heart disease Paternal Grandmother   . Hypertension Paternal Grandmother   . Heart attack Paternal Grandmother   . Diabetes Paternal Grandfather   . Kidney cancer Paternal Grandfather 66  . Hypertension Paternal Grandfather   . Melanoma Brother 41       on foot  . Pancreatic cancer Other 76  . Colon cancer Father 62  . Diabetes Maternal Uncle   . Breast cancer Neg Hx     Social History   Socioeconomic History  . Marital status: Divorced    Spouse name: Not on file  . Number of children: 2  . Years of education: Not on file  . Highest education level: Not on file  Occupational History  . Not on file  Tobacco Use  . Smoking status: Never Smoker  . Smokeless tobacco: Never Used  Vaping Use  . Vaping Use: Never used  Substance and Sexual Activity  . Alcohol use: Yes    Alcohol/week: 3.0 standard drinks    Types: 3 Glasses of wine per week    Comment: 1-2 glasses wine/month  . Drug use: No  . Sexual activity: Not Currently    Partners: Male    Birth control/protection: Surgical    Comment: Hysterectomy   Other Topics Concern  . Not on file  Social History Narrative  . Not on file   Social Determinants of Health   Financial Resource Strain: Not on file  Food Insecurity: Not on file  Transportation Needs: Not on file  Physical Activity: Not on file  Stress: Not  on file  Social Connections: Not on file  Intimate Partner Violence: Not on file    No current outpatient medications on file.     ROS:  Review of Systems  Constitutional: Negative for fatigue, fever and unexpected weight change.  Respiratory: Negative for cough, shortness of breath and wheezing.   Cardiovascular: Negative for chest pain, palpitations and leg swelling.  Gastrointestinal: Negative for blood in stool, constipation, diarrhea, nausea and vomiting.  Endocrine: Negative for cold intolerance, heat intolerance and polyuria.  Genitourinary: Negative for dyspareunia, dysuria, flank pain, frequency, genital sores, hematuria, menstrual problem, pelvic pain, urgency, vaginal bleeding, vaginal discharge and vaginal pain.  Musculoskeletal: Negative for back pain, joint swelling and myalgias.  Skin: Negative for rash.  Neurological: Negative for dizziness, syncope, light-headedness, numbness and headaches.  Hematological: Negative for adenopathy.  Psychiatric/Behavioral: Negative for agitation, confusion, sleep disturbance and suicidal ideas. The patient is not nervous/anxious.   BREAST: No symptoms    Objective: BP 118/76   Ht 5\' 5"  (1.651 m)   Wt 209 lb (94.8 kg)   BMI 34.78 kg/m    Physical Exam Constitutional:      Appearance: She is well-developed.  Genitourinary:     Vulva normal.     Right Labia: No rash, tenderness or lesions.    Left Labia: No tenderness, lesions or rash.    No vaginal discharge, erythema or tenderness.      Right Adnexa: not tender and no mass present.    Left Adnexa: not tender and no mass present.    No cervical friability or polyp.     Uterus is not enlarged or tender.  Breasts:     Right: No mass, nipple discharge, skin change or tenderness.     Left: No mass, nipple discharge, skin change or tenderness.    Neck:     Thyroid: No thyromegaly.  Cardiovascular:     Rate and Rhythm: Normal rate and regular rhythm.     Heart sounds:  Normal heart sounds. No murmur heard.   Pulmonary:     Effort: Pulmonary effort is normal.     Breath sounds: Normal breath sounds.  Abdominal:     Palpations: Abdomen is soft.     Tenderness: There is no abdominal tenderness. There is no guarding or rebound.  Musculoskeletal:        General: Normal range of motion.     Cervical back: Normal range of motion.  Lymphadenopathy:     Cervical: No cervical adenopathy.  Neurological:     General: No focal deficit present.     Mental Status: She is alert and oriented to person, place, and time.     Cranial Nerves:  No cranial nerve deficit.  Skin:    General: Skin is warm and dry.  Psychiatric:        Mood and Affect: Mood normal.        Behavior: Behavior normal.        Thought Content: Thought content normal.        Judgment: Judgment normal.  Vitals reviewed.     Assessment/Plan:  Encounter for annual routine gynecological examination  Encounter for screening mammogram for malignant neoplasm of breast - Plan: MM 3D SCREEN BREAST BILATERAL; pt to sched mammo  Blood tests for routine general physical examination - Plan: Comprehensive metabolic panel, Hemoglobin A1c  Screening for diabetes mellitus - Plan: Hemoglobin A1c  Family history of diabetes mellitus - Plan: Hemoglobin A1c          GYN counsel breast self exam, mammography screening, menopause, adequate intake of calcium and vitamin D, diet and exercise    F/U  Return in about 1 year (around 06/16/2021).  Shanen Norris B. Madden Piazza, PA-C 06/16/2020 9:46 AM

## 2020-06-12 NOTE — Patient Instructions (Addendum)
I value your feedback and you entrusting us with your care. If you get a [facility] patient survey, I would appreciate you taking the time to let us know about your experience today. Thank you!  [facility]: [phone]      

## 2020-06-16 ENCOUNTER — Ambulatory Visit (INDEPENDENT_AMBULATORY_CARE_PROVIDER_SITE_OTHER): Payer: 59 | Admitting: Obstetrics and Gynecology

## 2020-06-16 ENCOUNTER — Encounter: Payer: Self-pay | Admitting: Obstetrics and Gynecology

## 2020-06-16 ENCOUNTER — Other Ambulatory Visit: Payer: Self-pay

## 2020-06-16 VITALS — BP 118/76 | Ht 65.0 in | Wt 209.0 lb

## 2020-06-16 DIAGNOSIS — Z01419 Encounter for gynecological examination (general) (routine) without abnormal findings: Secondary | ICD-10-CM | POA: Diagnosis not present

## 2020-06-16 DIAGNOSIS — Z833 Family history of diabetes mellitus: Secondary | ICD-10-CM

## 2020-06-16 DIAGNOSIS — Z131 Encounter for screening for diabetes mellitus: Secondary | ICD-10-CM | POA: Diagnosis not present

## 2020-06-16 DIAGNOSIS — Z1231 Encounter for screening mammogram for malignant neoplasm of breast: Secondary | ICD-10-CM | POA: Diagnosis not present

## 2020-06-16 DIAGNOSIS — Z0000 Encounter for general adult medical examination without abnormal findings: Secondary | ICD-10-CM | POA: Diagnosis not present

## 2020-06-17 LAB — COMPREHENSIVE METABOLIC PANEL
ALT: 23 IU/L (ref 0–32)
AST: 15 IU/L (ref 0–40)
Albumin/Globulin Ratio: 1.7 (ref 1.2–2.2)
Albumin: 4.2 g/dL (ref 3.8–4.9)
Alkaline Phosphatase: 78 IU/L (ref 44–121)
BUN/Creatinine Ratio: 23 (ref 9–23)
BUN: 21 mg/dL (ref 6–24)
Bilirubin Total: 0.2 mg/dL (ref 0.0–1.2)
CO2: 23 mmol/L (ref 20–29)
Calcium: 10.1 mg/dL (ref 8.7–10.2)
Chloride: 105 mmol/L (ref 96–106)
Creatinine, Ser: 0.91 mg/dL (ref 0.57–1.00)
Globulin, Total: 2.5 g/dL (ref 1.5–4.5)
Glucose: 85 mg/dL (ref 65–99)
Potassium: 4.6 mmol/L (ref 3.5–5.2)
Sodium: 144 mmol/L (ref 134–144)
Total Protein: 6.7 g/dL (ref 6.0–8.5)
eGFR: 75 mL/min/{1.73_m2} (ref >59)

## 2020-06-17 LAB — HEMOGLOBIN A1C
Est. average glucose Bld gHb Est-mCnc: 117 mg/dL
Hgb A1c MFr Bld: 5.7 % — ABNORMAL HIGH (ref 4.8–5.6)

## 2020-07-14 ENCOUNTER — Ambulatory Visit
Admission: RE | Admit: 2020-07-14 | Discharge: 2020-07-14 | Disposition: A | Discharge status: Home / Self Care | Payer: 59 | Source: Ambulatory Visit | Attending: Obstetrics and Gynecology | Admitting: Obstetrics and Gynecology

## 2020-07-14 ENCOUNTER — Other Ambulatory Visit: Payer: Self-pay

## 2020-07-14 DIAGNOSIS — Z1231 Encounter for screening mammogram for malignant neoplasm of breast: Secondary | ICD-10-CM | POA: Diagnosis present

## 2020-07-15 ENCOUNTER — Other Ambulatory Visit: Payer: Self-pay | Admitting: Obstetrics and Gynecology

## 2020-07-15 DIAGNOSIS — R928 Other abnormal and inconclusive findings on diagnostic imaging of breast: Secondary | ICD-10-CM

## 2020-07-15 DIAGNOSIS — N6320 Unspecified lump in the left breast, unspecified quadrant: Secondary | ICD-10-CM

## 2020-07-30 ENCOUNTER — Ambulatory Visit
Admission: RE | Admit: 2020-07-30 | Discharge: 2020-07-30 | Disposition: A | Discharge status: Home / Self Care | Payer: 59 | Source: Ambulatory Visit | Attending: Obstetrics and Gynecology | Admitting: Obstetrics and Gynecology

## 2020-07-30 ENCOUNTER — Other Ambulatory Visit: Payer: Self-pay

## 2020-07-30 DIAGNOSIS — R928 Other abnormal and inconclusive findings on diagnostic imaging of breast: Secondary | ICD-10-CM | POA: Insufficient documentation

## 2020-07-30 DIAGNOSIS — N6320 Unspecified lump in the left breast, unspecified quadrant: Secondary | ICD-10-CM | POA: Insufficient documentation

## 2020-09-25 ENCOUNTER — Encounter: Payer: Self-pay | Admitting: Obstetrics and Gynecology

## 2020-10-02 ENCOUNTER — Encounter: Payer: Self-pay | Admitting: Obstetrics and Gynecology

## 2021-06-30 ENCOUNTER — Encounter: Payer: Self-pay | Admitting: Obstetrics and Gynecology

## 2021-06-30 DIAGNOSIS — Z1231 Encounter for screening mammogram for malignant neoplasm of breast: Secondary | ICD-10-CM

## 2021-06-30 DIAGNOSIS — R928 Other abnormal and inconclusive findings on diagnostic imaging of breast: Secondary | ICD-10-CM

## 2021-08-05 ENCOUNTER — Ambulatory Visit
Admission: RE | Admit: 2021-08-05 | Discharge: 2021-08-05 | Disposition: A | Discharge status: Home / Self Care | Payer: Commercial Managed Care - PPO | Source: Ambulatory Visit | Attending: Obstetrics and Gynecology | Admitting: Obstetrics and Gynecology

## 2021-08-05 DIAGNOSIS — Z1231 Encounter for screening mammogram for malignant neoplasm of breast: Secondary | ICD-10-CM | POA: Insufficient documentation

## 2021-08-05 DIAGNOSIS — R928 Other abnormal and inconclusive findings on diagnostic imaging of breast: Secondary | ICD-10-CM | POA: Insufficient documentation

## 2021-08-31 NOTE — Progress Notes (Unsigned)
PCP: [place]   No chief complaint on file.   HPI:      [name] is a 56 y.o. G2P2002 whose LMP was No LMP recorded. Patient has had a hysterectomy., presents today for her annual examination.  Her menses are absent due to [facility] due to adenomyosis 2011. No PMB. She does not have vasomotor sx.   Sex activity: not sexually active. She does not have vaginal dryness.  Last Pap: 03/15/18  Results were: no abnormalities /neg HPV DNA.  Hx of STDs: HPV; hx of cone bx many yrs ago  Last mammogram: 08/05/21  Results were: normal--routine follow-up in 12 months There is no FH of breast cancer. There is no FH of ovarian cancer. The patient does do self-breast exams.  Colonoscopy: 5/22 with Dr. Allen Norris with polyps;  Repeat due after 5 years. Strong FH colon cancer on both sides  Tobacco use: The patient denies current or previous tobacco use. Alcohol use: social drinker  No drug use Exercise: moderately active  She does get adequate calcium but not Vitamin D in her diet.  No recent labs done. Has strong FH DM. Didn't do labs last yr. Not fasting today.    Past Medical History:  Diagnosis Date   Adenomyosis    Basal cell carcinoma 2019   Breast mass 2009/2010   Colon polyps 05/2020   on colonoscopy; repeat after 5 yrs   Menorrhagia    Obesity (BMI 30.0-34.9)    PONV (postoperative nausea and vomiting)     Past Surgical History:  Procedure Laterality Date   ABDOMINAL HYSTERECTOMY     ANTERIOR CRUCIATE LIGAMENT REPAIR Left    BREAST CYST EXCISION     CARPAL TUNNEL RELEASE Bilateral 2011   CERVICAL CONE BIOPSY  1988   COLONOSCOPY WITH PROPOFOL N/A 06/04/2015   Procedure: COLONOSCOPY WITH PROPOFOL;  Surgeon: Christene Lye, MD;  Location: [facility];  Service: Endoscopy;  Laterality: N/A;   COLONOSCOPY WITH PROPOFOL N/A 06/09/2020   Procedure: COLONOSCOPY WITH BIOPSY;  Surgeon: Lucilla Lame, MD;  Location: [place];  Service:  Endoscopy;  Laterality: N/A;  priority 4   CRYOTHERAPY  1987   of cervix   DILATION AND CURETTAGE, DIAGNOSTIC / THERAPEUTIC  2011   LAPAROSCOPIC SUPRACERVICAL HYSTERECTOMY  10/2009   [facility] for adenomyosis Dr. Laurey Morale   POLYPECTOMY N/A 06/09/2020   Procedure: POLYPECTOMY;  Surgeon: Lucilla Lame, MD;  Location: [place];  Service: Endoscopy;  Laterality: N/A;    Family History  Problem Relation Age of Onset   Colon cancer Maternal Aunt 72   Colon cancer Maternal Grandmother 20   Alzheimer's disease Maternal Grandfather    Diabetes Paternal Grandmother    Heart disease Paternal Grandmother    Hypertension Paternal Grandmother    Heart attack Paternal Grandmother    Diabetes Paternal Grandfather    Kidney cancer Paternal Grandfather 83   Hypertension Paternal Grandfather    Melanoma Brother 70       on foot   Pancreatic cancer Other 59   Colon cancer Father 45   Diabetes Maternal Uncle    Breast cancer Neg Hx     Social History   Socioeconomic History   Marital status: Divorced    Spouse name: Not on file   Number of children: 2   Years of education: Not on file   Highest education level: Not on file  Occupational History   Not on file  Tobacco Use  Smoking status: Never   Smokeless tobacco: Never  Vaping Use   Vaping Use: Never used  Substance and Sexual Activity   Alcohol use: Yes    Alcohol/week: 3.0 standard drinks of alcohol    Types: 3 Glasses of wine per week    Comment: 1-2 glasses wine/month   Drug use: No   Sexual activity: Not Currently    Partners: Male    Birth control/protection: Surgical    Comment: Hysterectomy   Other Topics Concern   Not on file  Social History Narrative   Not on file   Social Determinants of Health   Financial Resource Strain: Not on file  Food Insecurity: Not on file  Transportation Needs: Not on file  Physical Activity: Not on file  Stress: Not on file  Social Connections: Not on file  Intimate Partner  Violence: Not on file    No current outpatient medications on file.     ROS:  Review of Systems  Constitutional:  Negative for fatigue, fever and unexpected weight change.  Respiratory:  Negative for cough, shortness of breath and wheezing.   Cardiovascular:  Negative for chest pain, palpitations and leg swelling.  Gastrointestinal:  Negative for blood in stool, constipation, diarrhea, nausea and vomiting.  Endocrine: Negative for cold intolerance, heat intolerance and polyuria.  Genitourinary:  Negative for dyspareunia, dysuria, flank pain, frequency, genital sores, hematuria, menstrual problem, pelvic pain, urgency, vaginal bleeding, vaginal discharge and vaginal pain.  Musculoskeletal:  Negative for back pain, joint swelling and myalgias.  Skin:  Negative for rash.  Neurological:  Negative for dizziness, syncope, light-headedness, numbness and headaches.  Hematological:  Negative for adenopathy.  Psychiatric/Behavioral:  Negative for agitation, confusion, sleep disturbance and suicidal ideas. The patient is not nervous/anxious.   BREAST: No symptoms    Objective: There were no vitals taken for this visit.   Physical Exam Constitutional:      Appearance: She is well-developed.  Genitourinary:     Vulva normal.     Right Labia: No rash, tenderness or lesions.    Left Labia: No tenderness, lesions or rash.    No vaginal discharge, erythema or tenderness.      Right Adnexa: not tender and no mass present.    Left Adnexa: not tender and no mass present.    No cervical friability or polyp.     Uterus is not enlarged or tender.  Breasts:    Right: No mass, nipple discharge, skin change or tenderness.     Left: No mass, nipple discharge, skin change or tenderness.  Neck:     Thyroid: No thyromegaly.  Cardiovascular:     Rate and Rhythm: Normal rate and regular rhythm.     Heart sounds: Normal heart sounds. No murmur heard. Pulmonary:     Effort: Pulmonary effort is  normal.     Breath sounds: Normal breath sounds.  Abdominal:     Palpations: Abdomen is soft.     Tenderness: There is no abdominal tenderness. There is no guarding or rebound.  Musculoskeletal:        General: Normal range of motion.     Cervical back: Normal range of motion.  Lymphadenopathy:     Cervical: No cervical adenopathy.  Neurological:     General: No focal deficit present.     Mental Status: She is alert and oriented to person, place, and time.     Cranial Nerves: No cranial nerve deficit.  Skin:    General: Skin is warm  and dry.  Psychiatric:        Mood and Affect: Mood normal.        Behavior: Behavior normal.        Thought Content: Thought content normal.        Judgment: Judgment normal.  Vitals reviewed.     Assessment/Plan:  Encounter for annual routine gynecological examination  Encounter for screening mammogram for malignant neoplasm of breast - Plan: MM 3D SCREEN BREAST BILATERAL; pt to sched mammo  Blood tests for routine general physical examination - Plan: Comprehensive metabolic panel, Hemoglobin A1c  Screening for diabetes mellitus - Plan: Hemoglobin A1c  Family history of diabetes mellitus - Plan: Hemoglobin A1c          GYN counsel breast self exam, mammography screening, menopause, adequate intake of calcium and vitamin D, diet and exercise    F/U  No follow-ups on file.  Rye Dorado B. Berdella Bacot, PA-C 08/31/2021 1:07 PM

## 2021-09-01 ENCOUNTER — Ambulatory Visit (INDEPENDENT_AMBULATORY_CARE_PROVIDER_SITE_OTHER): Payer: Commercial Managed Care - PPO | Admitting: Obstetrics and Gynecology

## 2021-09-01 ENCOUNTER — Other Ambulatory Visit (HOSPITAL_COMMUNITY)
Admission: RE | Admit: 2021-09-01 | Discharge: 2021-09-01 | Disposition: A | Discharge status: Home / Self Care | Payer: Commercial Managed Care - PPO | Source: Ambulatory Visit | Attending: Obstetrics and Gynecology | Admitting: Obstetrics and Gynecology

## 2021-09-01 ENCOUNTER — Encounter: Payer: Self-pay | Admitting: Obstetrics and Gynecology

## 2021-09-01 VITALS — BP 118/70 | Ht 65.0 in | Wt 215.0 lb

## 2021-09-01 DIAGNOSIS — Z124 Encounter for screening for malignant neoplasm of cervix: Secondary | ICD-10-CM | POA: Diagnosis not present

## 2021-09-01 DIAGNOSIS — Z1151 Encounter for screening for human papillomavirus (HPV): Secondary | ICD-10-CM

## 2021-09-01 DIAGNOSIS — Z01419 Encounter for gynecological examination (general) (routine) without abnormal findings: Secondary | ICD-10-CM

## 2021-09-01 DIAGNOSIS — Z1231 Encounter for screening mammogram for malignant neoplasm of breast: Secondary | ICD-10-CM

## 2021-09-01 DIAGNOSIS — R7303 Prediabetes: Secondary | ICD-10-CM

## 2021-09-01 NOTE — Patient Instructions (Signed)
I value your feedback and you entrusting us with your care. If you get a [facility] patient survey, I would appreciate you taking the time to let us know about your experience today. Thank you! ? ? ?

## 2021-09-02 LAB — HEMOGLOBIN A1C
Est. average glucose Bld gHb Est-mCnc: 123 mg/dL
Hgb A1c MFr Bld: 5.9 % — ABNORMAL HIGH (ref 4.8–5.6)

## 2021-09-03 LAB — CYTOLOGY - PAP
Comment: NEGATIVE
Diagnosis: NEGATIVE
High risk HPV: NEGATIVE

## 2021-10-28 IMAGING — MG MM DIGITAL SCREENING BILAT W/ TOMO AND CAD
8 series · 8 of 24 positions shown · non-contrast
Comparison: Previous exam(s).

ACR Breast Density Category a: The breast tissue is almost entirely
fatty.

CLINICAL DATA: Screening.

EXAM:
DIGITAL SCREENING BILATERAL MAMMOGRAM WITH TOMOSYNTHESIS AND CAD
TECHNIQUE: Bilateral screening digital craniocaudal and mediolateral oblique
mammograms were obtained. Bilateral screening digital breast
tomosynthesis was performed. The images were evaluated with
computer-aided detection.

[R CC synth-2D]
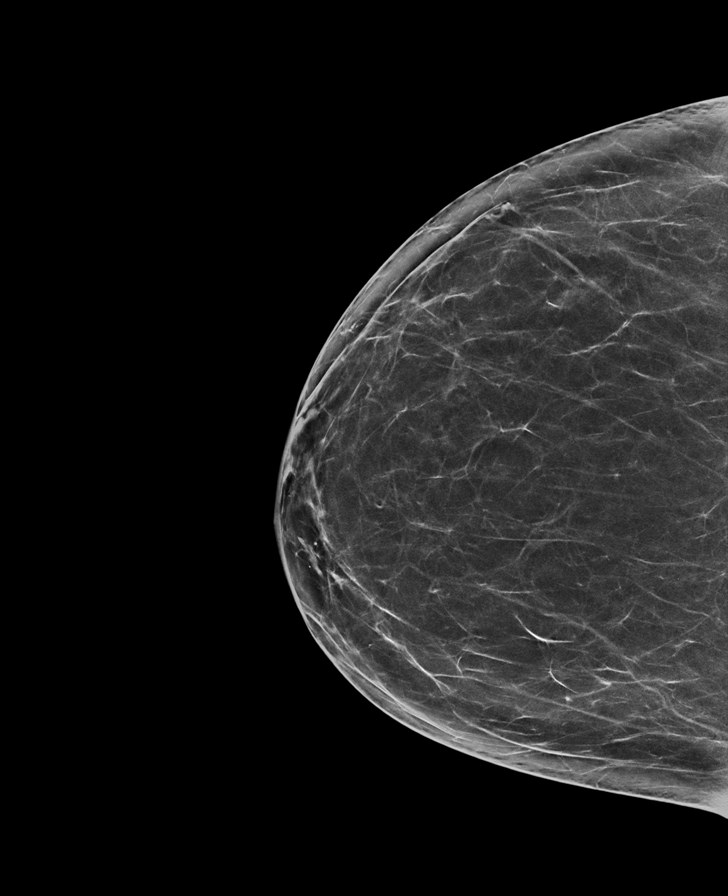

[R MLO synth-2D]
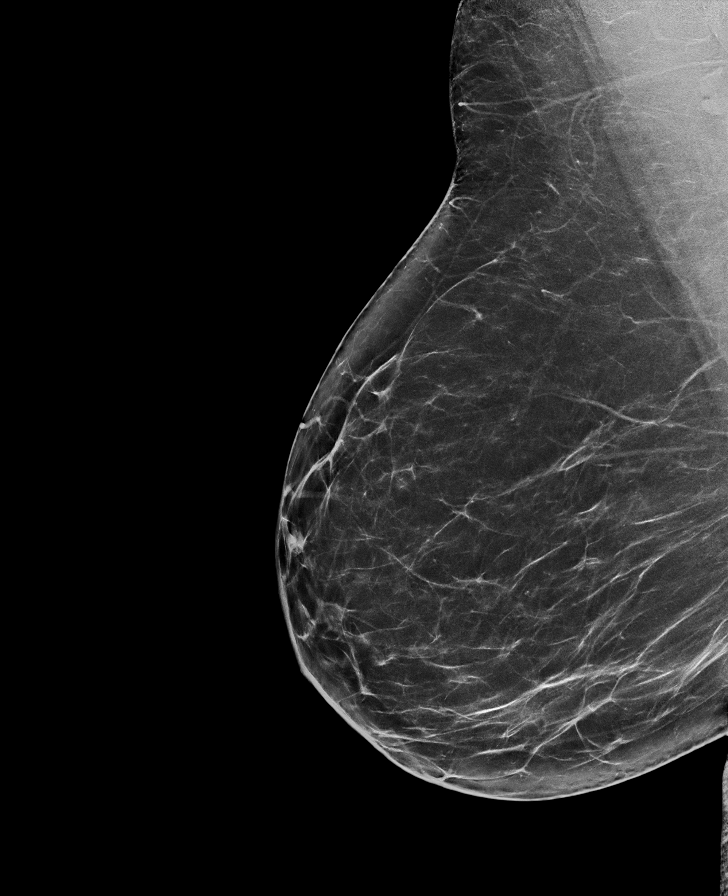

[L MLO synth-2D]
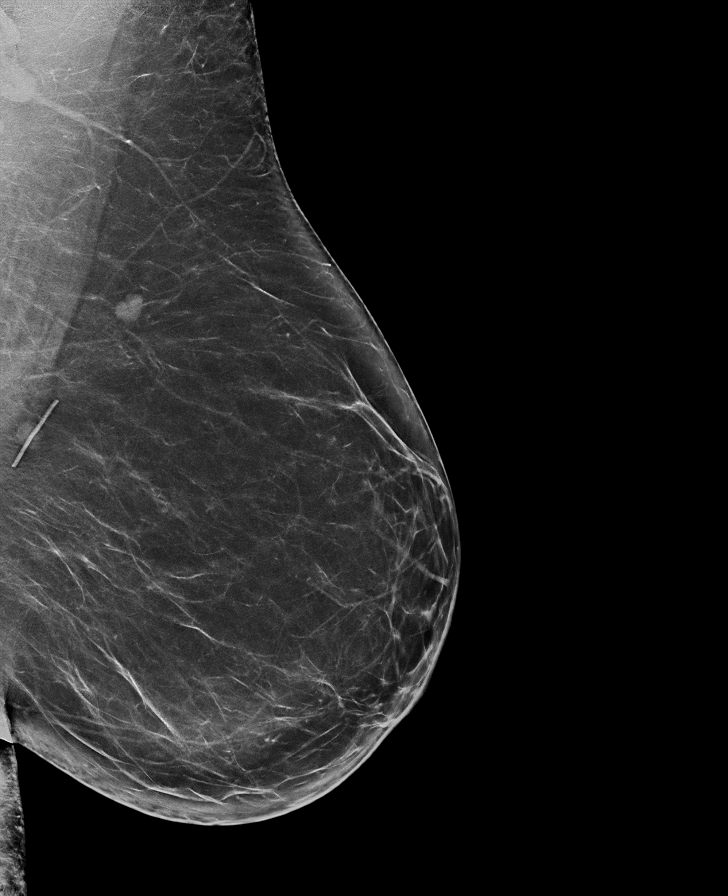

[L CC synth-2D]
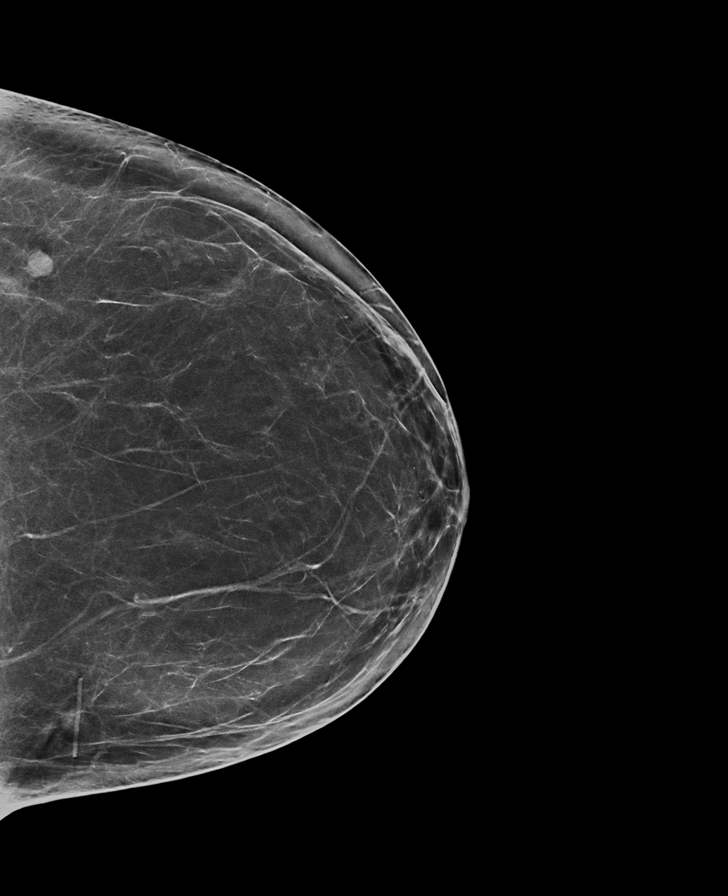

[L CC tomo · tomo slice 37/74.0]
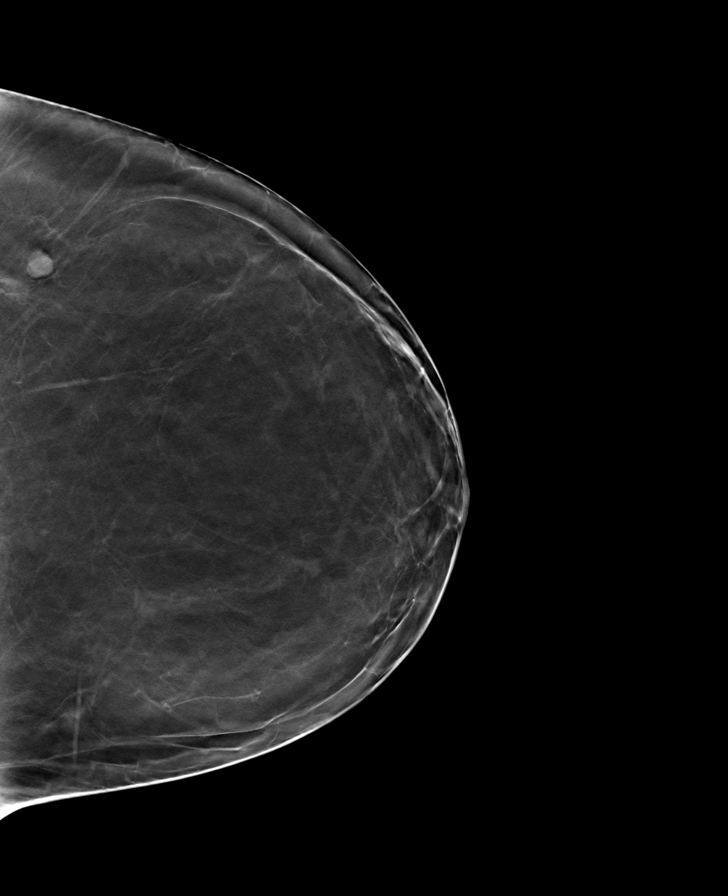

[L MLO tomo · tomo slice 41/81.0]
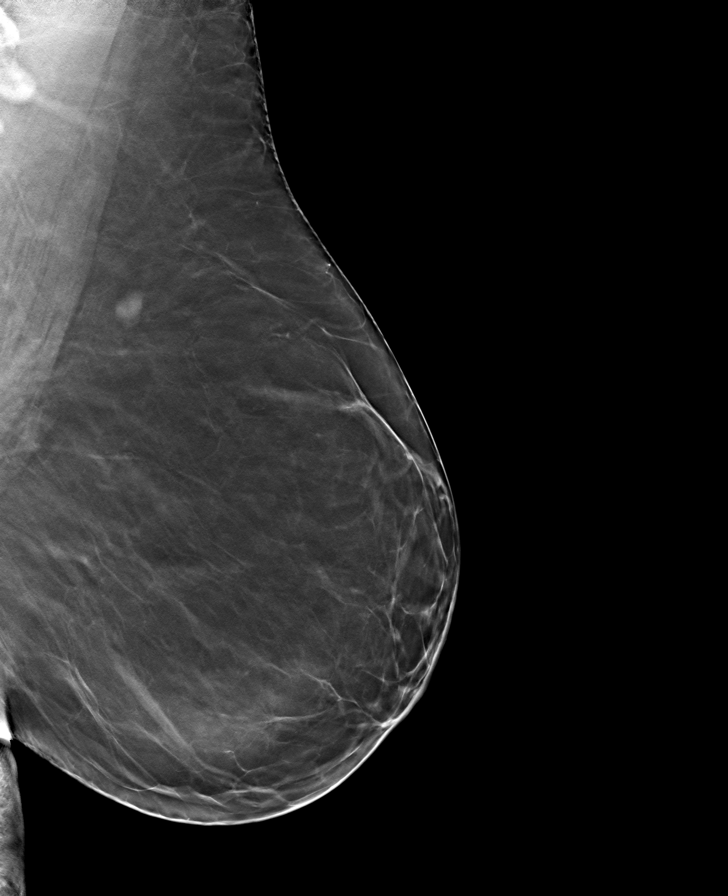

[R CC tomo · tomo slice 36/71.0]
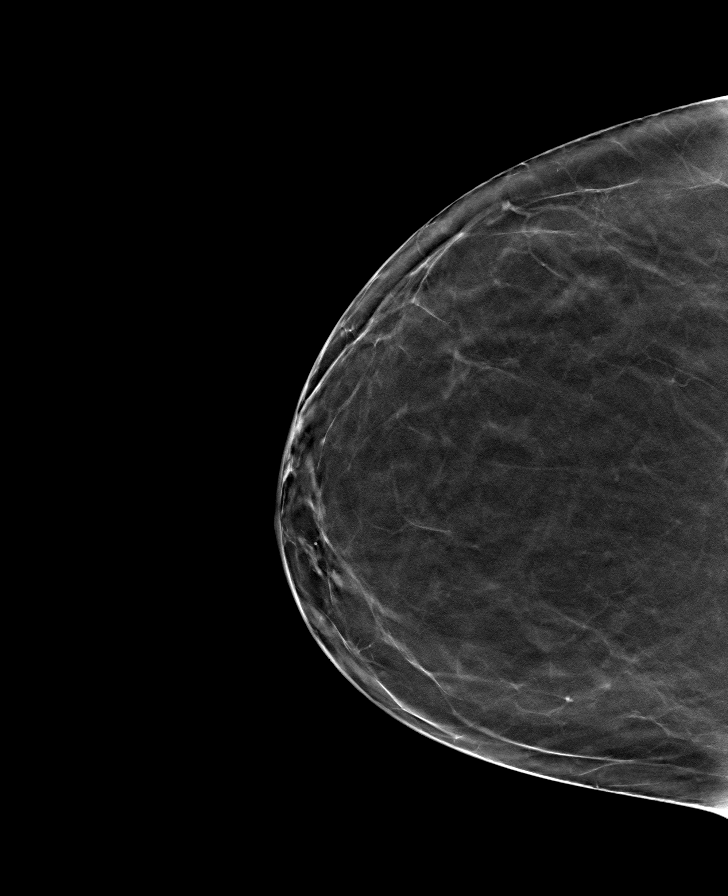

[R MLO tomo · tomo slice 41/82.0]
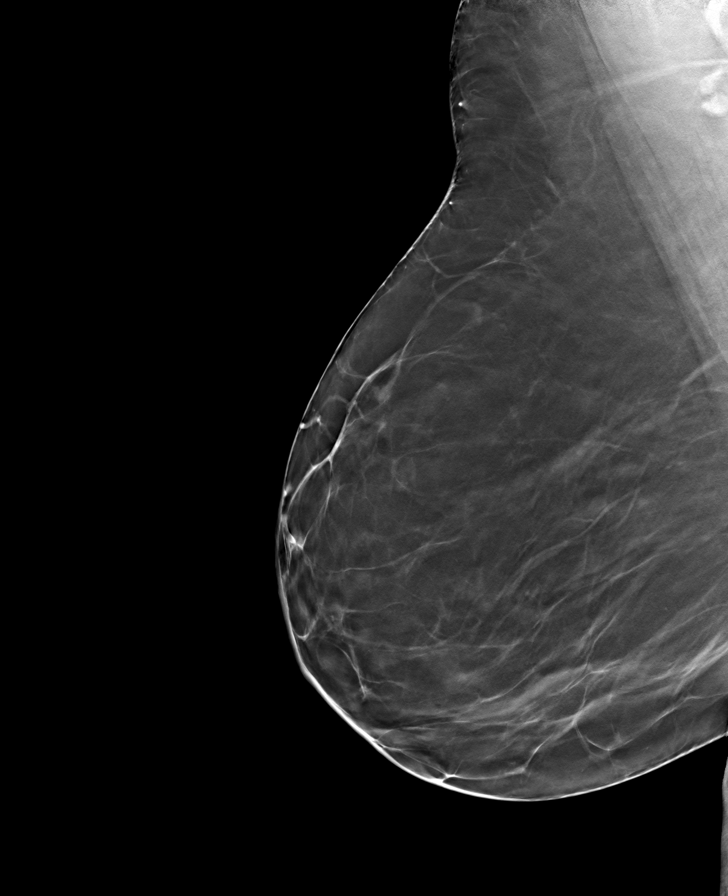

[8 of 24 positions shown; findings below may reference images not displayed]

FINDINGS: In the left breast, a possible mass warrants further evaluation. In
the right breast, no findings suspicious for malignancy.
IMPRESSION: Further evaluation is suggested for possible mass in the left
breast.

RECOMMENDATION:
Ultrasound of the left breast. (Code:F2-E-00M)

The patient will be contacted regarding the findings, and additional
imaging will be scheduled.

BI-RADS CATEGORY  0: Incomplete. Need additional imaging evaluation
and/or prior mammograms for comparison.

## 2022-07-06 ENCOUNTER — Telehealth: Payer: Self-pay | Admitting: Obstetrics and Gynecology

## 2022-07-06 DIAGNOSIS — Z1231 Encounter for screening mammogram for malignant neoplasm of breast: Secondary | ICD-10-CM

## 2022-07-06 NOTE — Telephone Encounter (Signed)
Patient called to schedule her annual(September 07, 2022 at 1:55 with Helmut Muster Copland), she would like to know if you could put the oder in for her Mammogram since she normally have it in July.  Please advise

## 2022-07-06 NOTE — Telephone Encounter (Signed)
Order placed, pt can call [facility] to schedule

## 2022-07-07 NOTE — Telephone Encounter (Signed)
Pt aware.

## 2022-08-10 ENCOUNTER — Ambulatory Visit
Admission: RE | Admit: 2022-08-10 | Discharge: 2022-08-10 | Disposition: A | Discharge status: Home / Self Care | Payer: Commercial Managed Care - PPO | Source: Ambulatory Visit | Attending: Obstetrics and Gynecology | Admitting: Obstetrics and Gynecology

## 2022-08-10 DIAGNOSIS — Z1231 Encounter for screening mammogram for malignant neoplasm of breast: Secondary | ICD-10-CM | POA: Diagnosis not present

## 2022-08-26 DIAGNOSIS — Z1371 Encounter for nonprocreative screening for genetic disease carrier status: Secondary | ICD-10-CM

## 2022-08-26 HISTORY — DX: Encounter for nonprocreative screening for genetic disease carrier status: Z13.71

## 2022-09-06 NOTE — Progress Notes (Unsigned)
PCP: [facility]   No chief complaint on file.   HPI:      [name] is a 57 y.o. [id number] whose LMP was No LMP recorded. Patient has had a hysterectomy., presents today for her annual examination.  Her menses are absent due to [facility] due to adenomyosis 2011. No PMB. She has tolerable vasomotor sx.   Sex activity: not sexually active. She does not have vaginal dryness/sx.  Last Pap: 09/01/21 Results were: no abnormalities /neg HPV DNA. Still has cx.  Hx of STDs: HPV; hx of cone bx over 25 yrs ago  Last mammogram: 08/10/22  Results were: normal--routine follow-up in 12 months There is no FH of breast cancer. There is no FH of ovarian cancer. The patient does do self-breast exams.  Colonoscopy: 5/22 with Dr. Servando Snare with polyps;  Repeat due after 5 years. Strong FH colon cancer on both sides, doesn't qualify for cancer genetic testing though  Tobacco use: The patient denies current or previous tobacco use. Alcohol use: social drinker  No drug use Exercise: moderately active  She does get adequate calcium and Vitamin D in her diet.  Has strong FH DM. Pre-DM on labs last yr, due for repeat today. Doing exercise, diet changes.    Past Medical History:  Diagnosis Date   Adenomyosis    Basal cell carcinoma 2019   Breast mass 2009/2010   Colon polyps 05/2020   on colonoscopy; repeat after 5 yrs   Menorrhagia    Obesity (BMI 30.0-34.9)    PONV (postoperative nausea and vomiting)     Past Surgical History:  Procedure Laterality Date   ABDOMINAL HYSTERECTOMY     ANTERIOR CRUCIATE LIGAMENT REPAIR Left    BREAST CYST EXCISION     CARPAL TUNNEL RELEASE Bilateral 2011   CERVICAL CONE BIOPSY  1988   COLONOSCOPY WITH PROPOFOL N/A 06/04/2015   Procedure: COLONOSCOPY WITH PROPOFOL;  Surgeon: Kieth Brightly, MD;  Location: [facility];  Service: Endoscopy;  Laterality: N/A;   COLONOSCOPY WITH PROPOFOL N/A 06/09/2020   Procedure: COLONOSCOPY WITH BIOPSY;   Surgeon: Midge Minium, MD;  Location: [facility];  Service: Endoscopy;  Laterality: N/A;  priority 4   CRYOTHERAPY  1987   of cervix   DILATION AND CURETTAGE, DIAGNOSTIC / THERAPEUTIC  2011   LAPAROSCOPIC SUPRACERVICAL HYSTERECTOMY  10/2009   [facility] for adenomyosis Dr. Luella Cook   POLYPECTOMY N/A 06/09/2020   Procedure: POLYPECTOMY;  Surgeon: Midge Minium, MD;  Location: [facility];  Service: Endoscopy;  Laterality: N/A;    Family History  Problem Relation Age of Onset   Colon cancer Maternal Aunt 72   Colon cancer Maternal Grandmother 7   Alzheimer's disease Maternal Grandfather    Diabetes Paternal Grandmother    Heart disease Paternal Grandmother    Hypertension Paternal Grandmother    Heart attack Paternal Grandmother    Diabetes Paternal Grandfather    Kidney cancer Paternal Grandfather 16   Hypertension Paternal Grandfather    Melanoma Brother 83       on foot   Pancreatic cancer Other 24   Colon cancer Father 82   Diabetes Maternal Uncle    Breast cancer Neg Hx     Social History   Socioeconomic History   Marital status: Divorced    Spouse name: Not on file   Number of children: 2   Years of education: Not on file   Highest education level: Not on file  Occupational History  Not on file  Tobacco Use   Smoking status: Never   Smokeless tobacco: Never  Vaping Use   Vaping status: Never Used  Substance and Sexual Activity   Alcohol use: Yes    Alcohol/week: 3.0 standard drinks of alcohol    Types: 3 Glasses of wine per week    Comment: 1-2 glasses wine/month   Drug use: No   Sexual activity: Not Currently    Partners: Male    Birth control/protection: Surgical    Comment: Hysterectomy   Other Topics Concern   Not on file  Social History Narrative   Not on file   Social Determinants of Health   Financial Resource Strain: Not on file  Food Insecurity: Not on file  Transportation Needs: Not on file  Physical Activity: Not on file   Stress: Not on file  Social Connections: Not on file  Intimate Partner Violence: Not on file    No current outpatient medications on file.     ROS:  Review of Systems  Constitutional:  Negative for fatigue, fever and unexpected weight change.  Respiratory:  Negative for cough, shortness of breath and wheezing.   Cardiovascular:  Negative for chest pain, palpitations and leg swelling.  Gastrointestinal:  Negative for blood in stool, constipation, diarrhea, nausea and vomiting.  Endocrine: Negative for cold intolerance, heat intolerance and polyuria.  Genitourinary:  Negative for dyspareunia, dysuria, flank pain, frequency, genital sores, hematuria, menstrual problem, pelvic pain, urgency, vaginal bleeding, vaginal discharge and vaginal pain.  Musculoskeletal:  Negative for back pain, joint swelling and myalgias.  Skin:  Negative for rash.  Neurological:  Negative for dizziness, syncope, light-headedness, numbness and headaches.  Hematological:  Negative for adenopathy.  Psychiatric/Behavioral:  Negative for agitation, confusion, sleep disturbance and suicidal ideas. The patient is not nervous/anxious.   BREAST: No symptoms    Objective: There were no vitals taken for this visit.   Physical Exam Constitutional:      Appearance: She is well-developed.  Genitourinary:     Vulva normal.     Right Labia: No rash, tenderness or lesions.    Left Labia: No tenderness, lesions or rash.    No vaginal discharge, erythema or tenderness.      Right Adnexa: not tender and no mass present.    Left Adnexa: not tender and no mass present.    No cervical friability or polyp.     Uterus is absent.  Breasts:    Right: No mass, nipple discharge, skin change or tenderness.     Left: No mass, nipple discharge, skin change or tenderness.  Neck:     Thyroid: No thyromegaly.  Cardiovascular:     Rate and Rhythm: Normal rate and regular rhythm.     Heart sounds: Normal heart sounds. No  murmur heard. Pulmonary:     Effort: Pulmonary effort is normal.     Breath sounds: Normal breath sounds.  Abdominal:     Palpations: Abdomen is soft.     Tenderness: There is no abdominal tenderness. There is no guarding or rebound.  Musculoskeletal:        General: Normal range of motion.     Cervical back: Normal range of motion.  Lymphadenopathy:     Cervical: No cervical adenopathy.  Neurological:     General: No focal deficit present.     Mental Status: She is alert and oriented to person, place, and time.     Cranial Nerves: No cranial nerve deficit.  Skin:  General: Skin is warm and dry.  Psychiatric:        Mood and Affect: Mood normal.        Behavior: Behavior normal.        Thought Content: Thought content normal.        Judgment: Judgment normal.  Vitals reviewed.     Assessment/Plan:  Encounter for annual routine gynecological examination  Cervical cancer screening - Plan: Cytology - PAP  Screening for HPV (human papillomavirus) - Plan: Cytology - PAP  Encounter for screening mammogram for malignant neoplasm of breast; pt current on mammo  Pre-diabetes - Plan: Hemoglobin A1c; check labs, will f/u with results         GYN counsel breast self exam, mammography screening, menopause, adequate intake of calcium and vitamin D, diet and exercise    F/U  No follow-ups on file.  Myrtis Maille B. Keirra Zeimet, PA-C 09/06/2022 4:55 PM

## 2022-09-07 ENCOUNTER — Encounter: Payer: Self-pay | Admitting: Obstetrics and Gynecology

## 2022-09-07 ENCOUNTER — Ambulatory Visit (INDEPENDENT_AMBULATORY_CARE_PROVIDER_SITE_OTHER): Payer: Commercial Managed Care - PPO | Admitting: Obstetrics and Gynecology

## 2022-09-07 VITALS — BP 120/90 | Ht 65.0 in | Wt 214.0 lb

## 2022-09-07 DIAGNOSIS — Z13220 Encounter for screening for lipoid disorders: Secondary | ICD-10-CM

## 2022-09-07 DIAGNOSIS — Z1231 Encounter for screening mammogram for malignant neoplasm of breast: Secondary | ICD-10-CM

## 2022-09-07 DIAGNOSIS — Z0000 Encounter for general adult medical examination without abnormal findings: Secondary | ICD-10-CM

## 2022-09-07 DIAGNOSIS — R7303 Prediabetes: Secondary | ICD-10-CM

## 2022-09-07 DIAGNOSIS — Z800 Family history of malignant neoplasm of digestive organs: Secondary | ICD-10-CM

## 2022-09-07 DIAGNOSIS — Z01419 Encounter for gynecological examination (general) (routine) without abnormal findings: Secondary | ICD-10-CM | POA: Diagnosis not present

## 2022-09-07 NOTE — Patient Instructions (Signed)
I value your feedback and you entrusting us with your care. If you get a [facility] patient survey, I would appreciate you taking the time to let us know about your experience today. Thank you! ? ? ?

## 2022-09-14 LAB — LIPID PANEL
Chol/HDL Ratio: 4.4 ratio (ref 0.0–4.4)
LDL Chol Calc (NIH): 148 mg/dL — ABNORMAL HIGH (ref 0–99)

## 2022-09-14 LAB — COMPREHENSIVE METABOLIC PANEL
ALT: 19 IU/L (ref 0–32)
Bilirubin Total: 0.4 mg/dL (ref 0.0–1.2)
CO2: 22 mmol/L (ref 20–29)
eGFR: 77 mL/min/{1.73_m2} (ref >59)

## 2022-09-17 ENCOUNTER — Other Ambulatory Visit: Payer: Self-pay | Admitting: Oncology

## 2022-09-17 DIAGNOSIS — Z006 Encounter for examination for normal comparison and control in clinical research program: Secondary | ICD-10-CM

## 2022-09-24 ENCOUNTER — Encounter: Payer: Self-pay | Admitting: Obstetrics and Gynecology

## 2022-10-12 ENCOUNTER — Telehealth: Payer: Self-pay | Admitting: Obstetrics and Gynecology

## 2022-10-12 NOTE — Telephone Encounter (Signed)
Pt aware of neg MyRisk results. IBIS=9.6%/riskscore=8.5%. No increased screening recommendations for pt.   Patient understands these results only apply to her and her children, and this is not indicative of genetic testing results of her other family members. It is recommended that her other family members have genetic testing done.  Pt also understands negative genetic testing doesn't mean she will never get any of these cancers.   Hard copy mailed to pt. F/u prn.

## 2023-06-27 ENCOUNTER — Encounter: Payer: Self-pay | Admitting: Obstetrics and Gynecology

## 2023-06-27 ENCOUNTER — Other Ambulatory Visit: Payer: Self-pay | Admitting: Obstetrics and Gynecology

## 2023-06-27 DIAGNOSIS — Z1231 Encounter for screening mammogram for malignant neoplasm of breast: Secondary | ICD-10-CM

## 2023-06-27 DIAGNOSIS — Z8249 Family history of ischemic heart disease and other diseases of the circulatory system: Secondary | ICD-10-CM

## 2023-07-13 ENCOUNTER — Ambulatory Visit
Admission: RE | Admit: 2023-07-13 | Discharge: 2023-07-13 | Disposition: A | Discharge status: Home / Self Care | Payer: Self-pay | Source: Ambulatory Visit | Attending: Obstetrics and Gynecology | Admitting: Obstetrics and Gynecology

## 2023-07-13 DIAGNOSIS — Z8249 Family history of ischemic heart disease and other diseases of the circulatory system: Secondary | ICD-10-CM | POA: Insufficient documentation

## 2023-07-14 ENCOUNTER — Ambulatory Visit: Payer: Self-pay | Admitting: Obstetrics and Gynecology

## 2023-08-11 ENCOUNTER — Ambulatory Visit
Admission: RE | Admit: 2023-08-11 | Discharge: 2023-08-11 | Disposition: A | Discharge status: Home / Self Care | Source: Ambulatory Visit | Attending: Obstetrics and Gynecology | Admitting: Obstetrics and Gynecology

## 2023-08-11 DIAGNOSIS — Z1231 Encounter for screening mammogram for malignant neoplasm of breast: Secondary | ICD-10-CM | POA: Diagnosis present

## 2023-08-16 ENCOUNTER — Ambulatory Visit: Payer: Self-pay | Admitting: Obstetrics and Gynecology

## 2023-09-29 ENCOUNTER — Encounter: Payer: Self-pay | Admitting: Obstetrics and Gynecology

## 2023-09-29 ENCOUNTER — Ambulatory Visit (INDEPENDENT_AMBULATORY_CARE_PROVIDER_SITE_OTHER): Admitting: Obstetrics and Gynecology

## 2023-09-29 VITALS — BP 120/73 | HR 101 | Ht 65.0 in | Wt 215.0 lb

## 2023-09-29 DIAGNOSIS — Z1231 Encounter for screening mammogram for malignant neoplasm of breast: Secondary | ICD-10-CM

## 2023-09-29 DIAGNOSIS — Z0000 Encounter for general adult medical examination without abnormal findings: Secondary | ICD-10-CM

## 2023-09-29 DIAGNOSIS — R7303 Prediabetes: Secondary | ICD-10-CM | POA: Diagnosis not present

## 2023-09-29 DIAGNOSIS — Z800 Family history of malignant neoplasm of digestive organs: Secondary | ICD-10-CM

## 2023-09-29 DIAGNOSIS — D72820 Lymphocytosis (symptomatic): Secondary | ICD-10-CM

## 2023-09-29 DIAGNOSIS — Z01419 Encounter for gynecological examination (general) (routine) without abnormal findings: Secondary | ICD-10-CM | POA: Diagnosis not present

## 2023-09-29 DIAGNOSIS — Z13220 Encounter for screening for lipoid disorders: Secondary | ICD-10-CM

## 2023-09-29 DIAGNOSIS — R718 Other abnormality of red blood cells: Secondary | ICD-10-CM

## 2023-09-29 NOTE — Patient Instructions (Signed)
 I value your feedback and you entrusting [place] with your care. If you get a [facility] patient survey, I would appreciate you taking the time to let us know about your experience today. Thank you! ? ? ?

## 2023-09-29 NOTE — Progress Notes (Signed)
 PCP: [facility]   Chief Complaint  Patient presents with   Gynecologic Exam    No concerns    HPI:      [name] is a 58 y.o. G2P2002 whose LMP was No LMP recorded. Patient has had a hysterectomy., presents today for her annual examination.  Her menses are absent due to [facility] due to adenomyosis 2011. No PMB/pelvic pain. She has improved, tolerable vasomotor sx.   Sex activity: not sexually active. She does not have vaginal dryness/sx.  Last Pap: 09/01/21 Results were: no abnormalities /neg HPV DNA. Still has cx.  Hx of STDs: HPV; hx of cone bx over 25 yrs ago  Last mammogram: 08/11/23  Results were: normal--routine follow-up in 12 months There is no FH of breast cancer. There is no FH of ovarian cancer. The patient does do self-breast exams. There is a FH of pancreatic cancer in mat grt aunt and unlce and colon cancer in her MGM, mat aunt, and melanoma in her brother; pt is MyRisk neg except RAD51D VUS; IBIS=9.6%/riskscore=8.5% .  Colonoscopy: 5/22 with Dr. Jinny with polyps;  Repeat due after 5 years. FH colon cancer on both sides.  Tobacco use: The patient denies current or previous tobacco use. Alcohol use: social drinker  No drug use Exercise: moderately active  She does get adequate calcium and Vitamin D in her diet.  Has strong FH DM. Pre-DM on labs 5/22 through 8/24, as well as elevated lipids, due for repeat labs, not fasting today. Doing exercise, diet changes.  CT cardiac score of 0 in 6/25.   Past Medical History:  Diagnosis Date   Adenomyosis    Basal cell carcinoma 2019   BRCA negative 08/2022   MyRisk neg except RAD51D VUS; IBIS=9.6%/riskscore=8.5%   Breast mass 2009/2010   Colon polyps 05/2020   on colonoscopy; repeat after 5 yrs   Family history of colon cancer    Family history of pancreatic cancer    Menorrhagia    Obesity (BMI 30.0-34.9)    PONV (postoperative nausea and vomiting)     Past Surgical History:  Procedure  Laterality Date   ABDOMINAL HYSTERECTOMY     ANTERIOR CRUCIATE LIGAMENT REPAIR Left    BREAST CYST EXCISION     CARPAL TUNNEL RELEASE Bilateral 2011   CERVICAL CONE BIOPSY  1988   COLONOSCOPY WITH PROPOFOL  N/A 06/04/2015   Procedure: COLONOSCOPY WITH PROPOFOL ;  Surgeon: Louanne KANDICE Muse, MD;  Location: [facility] ENDOSCOPY;  Service: Endoscopy;  Laterality: N/A;   COLONOSCOPY WITH PROPOFOL  N/A 06/09/2020   Procedure: COLONOSCOPY WITH BIOPSY;  Surgeon: Jinny Carmine, MD;  Location: [facility];  Service: Endoscopy;  Laterality: N/A;  priority 4   CRYOTHERAPY  1987   of cervix   DILATION AND CURETTAGE, DIAGNOSTIC / THERAPEUTIC  2011   LAPAROSCOPIC SUPRACERVICAL HYSTERECTOMY  10/2009   LSH for adenomyosis Dr. Rolm   POLYPECTOMY N/A 06/09/2020   Procedure: POLYPECTOMY;  Surgeon: Jinny Carmine, MD;  Location: [facility];  Service: Endoscopy;  Laterality: N/A;    Family History  Problem Relation Age of Onset   Heart attack Mother 17       CABG x 4   Colon cancer Father 46   Melanoma Brother 69       on foot   Colon cancer Maternal Grandmother 21   Alzheimer's disease Maternal Grandfather    Diabetes Paternal Grandmother    Heart disease Paternal Grandmother    Hypertension Paternal Grandmother  Heart attack Paternal Grandmother    Diabetes Paternal Grandfather    Kidney cancer Paternal Grandfather 64   Hypertension Paternal Grandfather    Colon cancer Maternal Aunt 72   Diabetes Maternal Uncle    Pancreatic cancer Maternal Uncle 72       Stage 4   Pancreatic cancer Other 78   Breast cancer Neg Hx     Social History   Socioeconomic History   Marital status: Divorced    Spouse name: Not on file   Number of children: 2   Years of education: Not on file   Highest education level: Not on file  Occupational History   Not on file  Tobacco Use   Smoking status: Never   Smokeless tobacco: Never  Vaping Use   Vaping status: Never Used  Substance and Sexual  Activity   Alcohol use: Yes    Alcohol/week: 3.0 standard drinks of alcohol    Types: 3 Glasses of wine per week    Comment: 1-2 glasses wine/month   Drug use: No   Sexual activity: Not Currently    Partners: Male    Birth control/protection: Surgical    Comment: Hysterectomy   Other Topics Concern   Not on file  Social History Narrative   Not on file   Social Drivers of Health   Financial Resource Strain: Not on file  Food Insecurity: Not on file  Transportation Needs: Not on file  Physical Activity: Not on file  Stress: Not on file  Social Connections: Not on file  Intimate Partner Violence: Not on file     Current Outpatient Medications:    Ascorbic Acid (VITAMIN C PO), , Disp: , Rfl:    CALCIUM PO, , Disp: , Rfl:      ROS:  Review of Systems  Constitutional:  Negative for fatigue, fever and unexpected weight change.  Respiratory:  Negative for cough, shortness of breath and wheezing.   Cardiovascular:  Negative for chest pain, palpitations and leg swelling.  Gastrointestinal:  Negative for blood in stool, constipation, diarrhea, nausea and vomiting.  Endocrine: Negative for cold intolerance, heat intolerance and polyuria.  Genitourinary:  Negative for dyspareunia, dysuria, flank pain, frequency, genital sores, hematuria, menstrual problem, pelvic pain, urgency, vaginal bleeding, vaginal discharge and vaginal pain.  Musculoskeletal:  Negative for back pain, joint swelling and myalgias.  Skin:  Negative for rash.  Neurological:  Negative for dizziness, syncope, light-headedness, numbness and headaches.  Hematological:  Negative for adenopathy.  Psychiatric/Behavioral:  Negative for agitation, confusion, sleep disturbance and suicidal ideas. The patient is not nervous/anxious.   BREAST: No symptoms    Objective: BP 120/73   Pulse (!) 101   Ht 5' 5 (1.651 m)   Wt 215 lb (97.5 kg)   BMI 35.78 kg/m    Physical Exam Constitutional:      Appearance: She is  well-developed.  Genitourinary:     Vulva normal.     Right Labia: No rash, tenderness or lesions.    Left Labia: No tenderness, lesions or rash.    No vaginal discharge, erythema or tenderness.      Right Adnexa: not tender and no mass present.    Left Adnexa: not tender and no mass present.    No cervical friability or polyp.     Uterus is absent.  Breasts:    Right: No mass, nipple discharge, skin change or tenderness.     Left: No mass, nipple discharge, skin change or tenderness.  Neck:  Thyroid: No thyromegaly.  Cardiovascular:     Rate and Rhythm: Normal rate and regular rhythm.     Heart sounds: Normal heart sounds. No murmur heard. Pulmonary:     Effort: Pulmonary effort is normal.     Breath sounds: Normal breath sounds.  Abdominal:     Palpations: Abdomen is soft.     Tenderness: There is no abdominal tenderness. There is no guarding or rebound.  Musculoskeletal:        General: Normal range of motion.     Cervical back: Normal range of motion.  Lymphadenopathy:     Cervical: No cervical adenopathy.  Neurological:     General: No focal deficit present.     Mental Status: She is alert and oriented to person, place, and time.     Cranial Nerves: No cranial nerve deficit.  Skin:    General: Skin is warm and dry.  Psychiatric:        Mood and Affect: Mood normal.        Behavior: Behavior normal.        Thought Content: Thought content normal.        Judgment: Judgment normal.  Vitals reviewed.     Assessment/Plan: Encounter for annual routine gynecological examination  Encounter for screening mammogram for malignant neoplasm of breast; pt current on mammo  Family history of colon cancer--pt is MyRisk neg; cont Q5 yr screening  Blood tests for routine general physical examination - Plan: CBC with Differential/Platelet, Comprehensive metabolic panel with GFR, Hemoglobin A1c, Lipid panel  Screening cholesterol level - Plan: Lipid panel  Pre-diabetes -  Plan: Hemoglobin A1c        GYN counsel breast self exam, mammography screening, menopause, adequate intake of calcium and vitamin D, diet and exercise    F/U  Return in about 1 year (around 09/28/2024).  Agam Tuohy B. Nichalos Brenton, PA-C 09/29/2023 2:13 PM

## 2023-10-03 ENCOUNTER — Other Ambulatory Visit

## 2023-10-03 DIAGNOSIS — Z0000 Encounter for general adult medical examination without abnormal findings: Secondary | ICD-10-CM

## 2023-10-03 DIAGNOSIS — Z13220 Encounter for screening for lipoid disorders: Secondary | ICD-10-CM

## 2023-10-03 DIAGNOSIS — R7303 Prediabetes: Secondary | ICD-10-CM

## 2023-10-04 ENCOUNTER — Ambulatory Visit: Payer: Self-pay | Admitting: Obstetrics and Gynecology

## 2023-10-04 LAB — COMPREHENSIVE METABOLIC PANEL WITH GFR
ALT: 27 IU/L (ref 0–32)
AST: 24 IU/L (ref 0–40)
Albumin: 4.7 g/dL (ref 3.8–4.9)
Alkaline Phosphatase: 83 IU/L (ref 44–121)
BUN/Creatinine Ratio: 22 (ref 9–23)
BUN: 20 mg/dL (ref 6–24)
Bilirubin Total: 0.5 mg/dL (ref 0.0–1.2)
CO2: 21 mmol/L (ref 20–29)
Calcium: 10 mg/dL (ref 8.7–10.2)
Chloride: 101 mmol/L (ref 96–106)
Creatinine, Ser: 0.91 mg/dL (ref 0.57–1.00)
Globulin, Total: 2.2 g/dL (ref 1.5–4.5)
Glucose: 96 mg/dL (ref 70–99)
Potassium: 4.7 mmol/L (ref 3.5–5.2)
Sodium: 139 mmol/L (ref 134–144)
Total Protein: 6.9 g/dL (ref 6.0–8.5)
eGFR: 73 mL/min/1.73 (ref >59)

## 2023-10-04 LAB — CBC WITH DIFFERENTIAL/PLATELET
Basophils Absolute: 0.1 x10E3/uL (ref 0.0–0.2)
Basos: 1 %
EOS (ABSOLUTE): 0.2 x10E3/uL (ref 0.0–0.4)
Eos: 3 %
Hematocrit: 42.6 % (ref 34.0–46.6)
Hemoglobin: 13.6 g/dL (ref 11.1–15.9)
Immature Grans (Abs): 0 x10E3/uL (ref 0.0–0.1)
Immature Granulocytes: 0 %
Lymphocytes Absolute: 4 x10E3/uL — ABNORMAL HIGH (ref 0.7–3.1)
Lymphs: 56 %
MCH: 31.5 pg (ref 26.6–33.0)
MCHC: 31.9 g/dL (ref 31.5–35.7)
MCV: 99 fL — ABNORMAL HIGH (ref 79–97)
Monocytes Absolute: 0.5 x10E3/uL (ref 0.1–0.9)
Monocytes: 7 %
Neutrophils Absolute: 2.3 x10E3/uL (ref 1.4–7.0)
Neutrophils: 33 %
Platelets: 235 x10E3/uL (ref 150–450)
RBC: 4.32 x10E6/uL (ref 3.77–5.28)
RDW: 12.4 % (ref 11.7–15.4)
WBC: 7.2 x10E3/uL (ref 3.4–10.8)

## 2023-10-04 LAB — LIPID PANEL
Chol/HDL Ratio: 4.3 ratio (ref 0.0–4.4)
Cholesterol, Total: 233 mg/dL — ABNORMAL HIGH (ref 100–199)
HDL: 54 mg/dL (ref >39)
LDL Chol Calc (NIH): 150 mg/dL — ABNORMAL HIGH (ref 0–99)
Triglycerides: 161 mg/dL — ABNORMAL HIGH (ref 0–149)
VLDL Cholesterol Cal: 29 mg/dL (ref 5–40)

## 2023-10-04 LAB — HEMOGLOBIN A1C
Est. average glucose Bld gHb Est-mCnc: 123 mg/dL
Hgb A1c MFr Bld: 5.9 % — ABNORMAL HIGH (ref 4.8–5.6)

## 2023-10-04 NOTE — Addendum Note (Signed)
 Addended by: WATT HILA B on: 10/04/2023 10:18 AM   Modules accepted: Orders

## 2023-10-18 ENCOUNTER — Other Ambulatory Visit

## 2023-10-18 DIAGNOSIS — D72820 Lymphocytosis (symptomatic): Secondary | ICD-10-CM

## 2023-10-18 DIAGNOSIS — R718 Other abnormality of red blood cells: Secondary | ICD-10-CM

## 2023-10-19 LAB — CBC WITH DIFFERENTIAL/PLATELET
Basophils Absolute: 0 x10E3/uL (ref 0.0–0.2)
Basos: 1 %
EOS (ABSOLUTE): 0.1 x10E3/uL (ref 0.0–0.4)
Eos: 2 %
Hematocrit: 43.6 % (ref 34.0–46.6)
Hemoglobin: 14.4 g/dL (ref 11.1–15.9)
Immature Grans (Abs): 0 x10E3/uL (ref 0.0–0.1)
Immature Granulocytes: 0 %
Lymphocytes Absolute: 4.3 x10E3/uL — ABNORMAL HIGH (ref 0.7–3.1)
Lymphs: 56 %
MCH: 32 pg (ref 26.6–33.0)
MCHC: 33 g/dL (ref 31.5–35.7)
MCV: 97 fL (ref 79–97)
Monocytes Absolute: 0.4 x10E3/uL (ref 0.1–0.9)
Monocytes: 5 %
Neutrophils Absolute: 2.7 x10E3/uL (ref 1.4–7.0)
Neutrophils: 36 %
Platelets: 203 x10E3/uL (ref 150–450)
RBC: 4.5 x10E6/uL (ref 3.77–5.28)
RDW: 12 % (ref 11.7–15.4)
WBC: 7.6 x10E3/uL (ref 3.4–10.8)

## 2023-10-19 LAB — B12 AND FOLATE PANEL
Folate: 9.3 ng/mL (ref >3.0)
Vitamin B-12: 364 pg/mL (ref 232–1245)

## 2023-11-18 ENCOUNTER — Other Ambulatory Visit: Payer: Self-pay | Admitting: Medical Genetics

## 2023-11-18 DIAGNOSIS — Z006 Encounter for examination for normal comparison and control in clinical research program: Secondary | ICD-10-CM

## 2024-01-04 LAB — GENECONNECT MOLECULAR SCREEN: Genetic Analysis Overall Interpretation: NEGATIVE
